# Patient Record
Sex: Female | Born: 1965
Health system: Southern US, Community
[De-identification: ages and names within clinical notes are randomized; demographics above are authoritative.]

## PROBLEM LIST (undated history)

## (undated) DIAGNOSIS — B009 Herpesviral infection, unspecified: Secondary | ICD-10-CM

## (undated) DIAGNOSIS — D649 Anemia, unspecified: Secondary | ICD-10-CM

## (undated) DIAGNOSIS — E7800 Pure hypercholesterolemia, unspecified: Secondary | ICD-10-CM

## (undated) DIAGNOSIS — D219 Benign neoplasm of connective and other soft tissue, unspecified: Secondary | ICD-10-CM

## (undated) DIAGNOSIS — R011 Cardiac murmur, unspecified: Secondary | ICD-10-CM

## (undated) DIAGNOSIS — R7303 Prediabetes: Secondary | ICD-10-CM

## (undated) DIAGNOSIS — I10 Essential (primary) hypertension: Secondary | ICD-10-CM

## (undated) HISTORY — DX: Prediabetes: R73.03

## (undated) HISTORY — DX: Pure hypercholesterolemia, unspecified: E78.00

## (undated) HISTORY — DX: Anemia, unspecified: D64.9

## (undated) HISTORY — DX: Cardiac murmur, unspecified: R01.1

## (undated) HISTORY — PX: BREAST EXCISIONAL BIOPSY: SUR124

## (undated) HISTORY — DX: Essential (primary) hypertension: I10

## (undated) HISTORY — DX: Herpesviral infection, unspecified: B00.9

## (undated) HISTORY — PX: UTERINE FIBROID EMBOLIZATION: SHX825

## (undated) HISTORY — PX: PELVIC LAPAROSCOPY: SHX162

## (undated) HISTORY — PX: HYSTEROPLASTY: SHX988

## (undated) HISTORY — DX: Benign neoplasm of connective and other soft tissue, unspecified: D21.9

---

## 1999-06-19 ENCOUNTER — Other Ambulatory Visit: Admission: RE | Admit: 1999-06-19 | Discharge: 1999-06-19 | Payer: Self-pay | Admitting: Obstetrics and Gynecology

## 2000-06-17 ENCOUNTER — Other Ambulatory Visit: Admission: RE | Admit: 2000-06-17 | Discharge: 2000-06-17 | Payer: Self-pay | Admitting: Internal Medicine

## 2001-06-18 ENCOUNTER — Other Ambulatory Visit: Admission: RE | Admit: 2001-06-18 | Discharge: 2001-06-18 | Payer: Self-pay | Admitting: Gynecology

## 2002-06-22 ENCOUNTER — Other Ambulatory Visit: Admission: RE | Admit: 2002-06-22 | Discharge: 2002-06-22 | Payer: Self-pay | Admitting: Gynecology

## 2003-06-28 ENCOUNTER — Other Ambulatory Visit: Admission: RE | Admit: 2003-06-28 | Discharge: 2003-06-28 | Payer: Self-pay | Admitting: Gynecology

## 2003-07-18 HISTORY — PX: MYOMECTOMY ABDOMINAL APPROACH: SUR870

## 2003-08-13 ENCOUNTER — Inpatient Hospital Stay (HOSPITAL_COMMUNITY): Admission: AD | Admit: 2003-08-13 | Discharge: 2003-08-16 | Payer: Self-pay | Admitting: Gynecology

## 2003-08-13 ENCOUNTER — Encounter (INDEPENDENT_AMBULATORY_CARE_PROVIDER_SITE_OTHER): Payer: Self-pay

## 2003-08-14 ENCOUNTER — Encounter: Payer: Self-pay | Admitting: Gynecology

## 2003-08-16 ENCOUNTER — Encounter: Payer: Self-pay | Admitting: Gynecology

## 2003-10-27 ENCOUNTER — Encounter: Admission: RE | Admit: 2003-10-27 | Discharge: 2003-10-27 | Payer: Self-pay | Admitting: Gynecology

## 2004-06-28 ENCOUNTER — Other Ambulatory Visit: Admission: RE | Admit: 2004-06-28 | Discharge: 2004-06-28 | Payer: Self-pay | Admitting: Gynecology

## 2004-12-17 HISTORY — PX: BREAST BIOPSY: SHX20

## 2005-06-29 ENCOUNTER — Other Ambulatory Visit: Admission: RE | Admit: 2005-06-29 | Discharge: 2005-07-10 | Payer: Self-pay | Admitting: Gynecology

## 2005-12-19 ENCOUNTER — Encounter: Admission: RE | Admit: 2005-12-19 | Discharge: 2005-12-19 | Payer: Self-pay | Admitting: Gynecology

## 2006-01-22 ENCOUNTER — Encounter: Admission: RE | Admit: 2006-01-22 | Discharge: 2006-01-22 | Payer: Self-pay | Admitting: Gynecology

## 2006-05-08 ENCOUNTER — Encounter (INDEPENDENT_AMBULATORY_CARE_PROVIDER_SITE_OTHER): Payer: Self-pay | Admitting: *Deleted

## 2006-05-08 ENCOUNTER — Ambulatory Visit (HOSPITAL_BASED_OUTPATIENT_CLINIC_OR_DEPARTMENT_OTHER): Admission: RE | Admit: 2006-05-08 | Discharge: 2006-05-08 | Payer: Self-pay | Admitting: *Deleted

## 2006-05-08 ENCOUNTER — Encounter: Admission: RE | Admit: 2006-05-08 | Discharge: 2006-05-08 | Payer: Self-pay | Admitting: *Deleted

## 2006-07-12 ENCOUNTER — Other Ambulatory Visit: Admission: RE | Admit: 2006-07-12 | Discharge: 2006-07-12 | Payer: Self-pay | Admitting: Gynecology

## 2007-07-23 ENCOUNTER — Other Ambulatory Visit: Admission: RE | Admit: 2007-07-23 | Discharge: 2007-07-23 | Payer: Self-pay | Admitting: Gynecology

## 2007-09-22 ENCOUNTER — Encounter: Admission: RE | Admit: 2007-09-22 | Discharge: 2007-09-22 | Payer: Self-pay | Admitting: Gynecology

## 2008-07-26 ENCOUNTER — Other Ambulatory Visit: Admission: RE | Admit: 2008-07-26 | Discharge: 2008-07-26 | Payer: Self-pay | Admitting: Gynecology

## 2008-08-13 ENCOUNTER — Other Ambulatory Visit: Admission: RE | Admit: 2008-08-13 | Discharge: 2008-08-13 | Payer: Self-pay | Admitting: Gynecology

## 2008-10-26 ENCOUNTER — Encounter: Admission: RE | Admit: 2008-10-26 | Discharge: 2008-10-26 | Payer: Self-pay | Admitting: Gynecology

## 2009-08-04 ENCOUNTER — Other Ambulatory Visit: Admission: RE | Admit: 2009-08-04 | Discharge: 2009-08-04 | Payer: Self-pay | Admitting: Gynecology

## 2009-08-04 ENCOUNTER — Ambulatory Visit: Payer: Self-pay | Admitting: Women's Health

## 2009-08-04 ENCOUNTER — Encounter: Payer: Self-pay | Admitting: Women's Health

## 2009-10-27 ENCOUNTER — Encounter: Admission: RE | Admit: 2009-10-27 | Discharge: 2009-10-27 | Payer: Self-pay | Admitting: Gynecology

## 2009-11-17 ENCOUNTER — Encounter: Admission: RE | Admit: 2009-11-17 | Discharge: 2009-11-17 | Payer: Self-pay | Admitting: Gynecology

## 2010-08-11 ENCOUNTER — Other Ambulatory Visit: Admission: RE | Admit: 2010-08-11 | Discharge: 2010-08-11 | Payer: Self-pay | Admitting: Gynecology

## 2010-08-11 ENCOUNTER — Ambulatory Visit: Payer: Self-pay | Admitting: Women's Health

## 2010-11-21 ENCOUNTER — Encounter: Admission: RE | Admit: 2010-11-21 | Discharge: 2010-11-21 | Payer: Self-pay | Admitting: Gynecology

## 2010-12-29 ENCOUNTER — Encounter
Admission: RE | Admit: 2010-12-29 | Discharge: 2010-12-29 | Payer: Self-pay | Source: Home / Self Care | Attending: Gynecology | Admitting: Gynecology

## 2011-01-07 ENCOUNTER — Encounter: Payer: Self-pay | Admitting: Gynecology

## 2011-04-09 ENCOUNTER — Ambulatory Visit (INDEPENDENT_AMBULATORY_CARE_PROVIDER_SITE_OTHER): Payer: Federal, State, Local not specified - PPO | Admitting: Women's Health

## 2011-04-09 DIAGNOSIS — N898 Other specified noninflammatory disorders of vagina: Secondary | ICD-10-CM

## 2011-04-09 DIAGNOSIS — Z113 Encounter for screening for infections with a predominantly sexual mode of transmission: Secondary | ICD-10-CM

## 2011-04-09 DIAGNOSIS — B373 Candidiasis of vulva and vagina: Secondary | ICD-10-CM

## 2011-05-04 NOTE — Discharge Summary (Signed)
   NAME:  Christina Sweeney, Christina Sweeney                        ACCOUNT NO.:  [id number]   MEDICAL RECORD NO.:  [id number]                   PATIENT TYPE:  INP   LOCATION:  9116                                 FACILITY:  [facility]   PHYSICIAN:  Juan H. Lily Peer, M.D.             DATE OF BIRTH:  [birth year]   DATE OF ADMISSION:  08/13/2003  DATE OF DISCHARGE:  08/16/2003                                 DISCHARGE SUMMARY   DISCHARGE DIAGNOSES:  1. Symptomatic leiomyomata uterus with anemia.  2. Menorrhagia.   PROCEDURE:  Myomectomy.   HISTORY OF PRESENT ILLNESS:  This 45 year old gravida 0 since 2000, has been  suffering with menorrhagia and history of a leiomyomata uterus. She has had  several serial ultrasounds, her last 1 in 2001 and an ultrasound in 2004  demonstrating a uterus measuring approximately 14 week size with multiple  intramural myomas, subserosal, and fibroids also. Right and left ovary  appear normal. Her CBC demonstrated a hemoglobin of 11.9. She had decided  because of the menorrhagia, long term anemia, and desiring a pregnancy, to  have these fibroids removed.   HOSPITAL COURSE:  The patient is admitted on August 13, 2003 for a  myomectomy, which was performed by Dr. Lily Peer under general anesthesia.  She had multiple myomas, approximately 25, that were removed.  Postoperatively, she remained afebrile. No difficulties voiding. She did  have shortness of breath and she did have a chest x-ray. The first one  showing low lung volumes and mild bilateral subsegmental atelectasis. She  had a repeat one done on August 16, 2003, which showed no acute disease. The  patient also was feeling much better with the second one. She was discharged  in satisfactory condition on the third postoperative day.   LABORATORY DATA:  Postoperative laboratory white count was 11.2, hemoglobin  10.1, hematocrit 29. Platelets of 2,000.   DISPOSITION:  The patient was discharged to home.   FOLLOW UP:   Instructed to followup in 2 weeks and as needed.   DISCHARGE MEDICATIONS:  1. Prescription for Tylox was given.  2. Continue multivitamins and iron daily.   SPECIAL INSTRUCTIONS:  Staples were removed. Steri-strips applied.  Instructions reviewed.     Susa Loffler, P.A.                    Juan H. Lily Peer, M.D.    TSG/MEDQ  D:  08/30/2003  T:  08/30/2003  Job:  [id number]

## 2011-05-04 NOTE — Op Note (Signed)
NAME:  Christina Sweeney, Christina Sweeney                        ACCOUNT NO.:  [id number]   MEDICAL RECORD NO.:  [id number]                   PATIENT TYPE:  INP   LOCATION:  9327                                 [facility]ACILITY:  [facility]   PHYSICIAN:  Juan H. Lily Peer, M.D.             DATE OF BIRTH:  1966/08/22   DATE OF PROCEDURE:  08/13/2003  DATE OF DISCHARGE:                                 OPERATIVE REPORT   SURGEON:  Juan H. Lily Peer, M.D.   FIRST ASSISTANT:  Daniel L. Eda Paschal, M.D.   INDICATIONS FOR PROCEDURE:  45 year old female, gravida 0, para 0, with long-  standing  history of symptomatic leiomyomatous uteri resulting in  menorrhagia, dysmenorrhea, and anemia.   PREOPERATIVE DIAGNOSIS:  Symptomatic leiomyomatous uteri.   POSTOPERATIVE DIAGNOSIS:  Symptomatic leiomyomatous uteri.   ANESTHESIA:  General endotracheal anesthesia.   PROCEDURE PERFORMED:  1. Chromopertubation.  2. Abdominal myomectomy.   FINDINGS:  1. Multiple subserosal and intramural leiomyomas.  2. Normal fallopian tubes bilaterally.  3. Free flowing dye via chromopertubation for the left fallopian tube.  4. Proximal tubal obstruction secondary to fibroids in the utero-tubal     junction, no spillage from the right fallopian tube.   DESCRIPTION OF PROCEDURE:  After the patient was adequately counseled, she  was taken to the operating room where she underwent a successful general  endotracheal anesthesia.  The abdomen, vagina, and perineum were prepped and  draped in the usual sterile fashion.  A speculum was inserted into the  vaginal vault and a single tooth tenaculum was placed on the anterior  cervical lip.  A pediatric Foley catheter was inserted into the intrauterine  cavity and inflated in an effort to do a chromopertubation interoperatively.  The single tooth tenaculum and the speculum were removed.  Drapes were then  placed after the Foley catheter was placed into the bladder and a  Pfannenstiel skin incision  was made 2 cm above the symphysis pubis.  The  incision was carried down through the skin, subcutaneous tissue, down to the  rectus fascia where a midline nick was made.  The fascia was incised in a  transverse fashion and the peritoneal cavity was entered.  Due to the  multiple leiomyomas that were present, the uterus was able to be  exteriorized and the base of the subserosal leiomyomas were infiltrated with  Pitressin, 20 units in 20 mL for 1 to 1 dilution, and were excised with the  fine tip Bovie.  A fundal vertical incision was made on top of the uterus  whereby the majority of the fibroids were located and were able to be  enucleated.  Total number of fibroids removed were 23.  The intrauterine  cavity was entered, some of these fibroids were impinging into the  endometrial cavity.  The endometrium was closed with a running stitch of 3-0  Vicryl suture and the uterine muscle was reapproximated with a running  stitch of 0 Vicryl suture and the serosa was reapproximated with a running  stitch of 3-0 Vicryl suture.  The two large anterior fibroids that were  enucleated, the serosa was reapproximated with a running stitch of 3-0  Vicryl suture.  Pre and post myomectomy procedures were obtained.  Of note,  before the myomectomy, the chromopertubation was accomplished whereby free  flowing dye was noted from the left fallopian tube but the right did not  spill as a result of a large leiomyoma that was compressing right at the  uterotubal junction.  The uterus, tubes, and ovaries were placed back into  the uterine cavity.  The tubes and ovaries, otherwise, looked normal.  The  pelvic cavity was copiously irrigated with normal saline solution.  Sponge  count and needle counts were correct.  The visceroperitoneum was not  reapproximated but the rectus fascia was closed with a running stitch of 0  Vicryl suture.  The subcutaneous bleeders were Bovie cauterized and the skin  was reapproximated  with 3-0 Vicryl suture via a Keith needle.  The incision  had Steri-Strips applied.  0.25% Marcaine for a total of 10 mL was  infiltrated into the skin for postoperative analgesia and a pressure  dressing was placed.  The patient was extubated and transferred to the  recovery room with stable vital signs.  The intrauterine catheter was  removed, the bladder catheter was left in place, total blood loss from the  procedure was 100 mL.  Urine output 1500 mL.  IV fluids consisted of 1500 mL  of lactated ringers.  She did receive 2 grams Cefotan prophylactically  before her surgery.  She had PSA stockings for DVT prophylaxis.                                               Juan H. Lily Peer, M.D.    JHF/MEDQ  D:  08/13/2003  T:  08/13/2003  Job:  [id number]

## 2011-05-04 NOTE — Op Note (Signed)
Christina Sweeney, Christina Sweeney              ACCOUNT NO.:  [id number]   MEDICAL RECORD NO.:  [id number]          PATIENT TYPE:  AMB   LOCATION:  DSC                          FACILITY:  [facility]   PHYSICIAN:  Alfonse Ras, MD   DATE OF BIRTH:  [birth year]   DATE OF PROCEDURE:  05/08/2006  DATE OF DISCHARGE:                                 OPERATIVE REPORT   PREOPERATIVE DIAGNOSIS:  Abnormal right mammogram.   POSTOPERATIVE DIAGNOSIS:  Abnormal right mammogram.   PROCEDURE:  Needle localization right breast biopsy.   SURGEON:  Alfonse Ras, M.D.   ANESTHESIA:  General.   DESCRIPTION OF PROCEDURE:  The patient was taken to the operating room and  placed in the supine position.  After adequate anesthesia was induced using  laryngeal mask, the right breast was prepped and draped in the usual sterile  fashion.  Using a transverse incision over the lateral aspect of the right  breast extending from the areola out laterally, I dissected down onto the  wire excising all tissue around the reinforced portion of the wire down to  the pectoralis fascia.  All this tissue was removed en block.  The wire did  slip during excision.  It was reinserted along the same tract.  Specimen  mammography verified the presence of the calcifications and adequate  hemostasis was ensured and the skin was closed with subcuticular 4-0  Monocryl.  Steri-Strips, sterile dressing was applied.  The patient  tolerated the procedure well and went to PACU in good condition.      Alfonse Ras, MD  Electronically Signed     KRE/MEDQ  D:  05/08/2006  T:  05/08/2006  Job:  [id number]

## 2011-05-04 NOTE — H&P (Signed)
NAME:  Christina Sweeney, Christina Sweeney                        ACCOUNT NO.:  [id number]   MEDICAL RECORD NO.:  [id number]                   PATIENT TYPE:  INP   LOCATION:  9399                                 [facility]ACILITY[facility]   PHYSICIAN:  Juan H. Lily Peer, M.D.             DATE OF BIRTH:  [birth year]   DATE OF ADMISSION:  08/13/2003  DATE OF DISCHARGE:                                HISTORY & PHYSICAL   CHIEF COMPLAINT:  Symptomatic leiomyomatous uteri and anemia.   HISTORY OF PRESENT ILLNESS:  The patient is a 45 year old, G0 who since the  year 2000, has been suffering from menorrhagia and has a history of  leiomyomatous uteri and had been followed with serial ultrasounds.  Her last  one was in 2001, and now the ultrasound done on June 28, 2003, demonstrating  a uterus measuring approximately 14-week size with measurements of 14.1 x  11.2 x 9.4 cm with multiple intramural myomas, subserosal averaging between  4-6 cm in size.  The right ovary and left ovary appeared to be normal as  well.  The cul-de-sac was negative.  She has a Pap smear done at that time  as well which reported to be normal.  Her CBC demonstrated a hemoglobin of  11.9 and platelet count of 251,000.  She had decided because of her  menorrhagia and long-term anemia, for which she had been on iron  supplementation for several years of one tablet daily and the pressure of  these fibroids on her bladder causing her urinary frequency and bloating,  that she would proceed with myomectomy since she is contemplating on getting  pregnant in the near future.   ALLERGIES:  No known drug allergies.   PAST MEDICAL HISTORY:  1. Herpes for which she has taken Valtrex in the past.  2. Borderline hypertension on no medications.   PAST SURGICAL HISTORY:  No prior operations reported.   MEDICATIONS:  1. Multivitamin.  2. Calcium supplementation.   FAMILY HISTORY:  Mother with history of hypertension.   PHYSICAL EXAMINATION:  VITAL SIGNS:   Blood pressure 130/92, weight 158  pounds, height 5 feet 2.5 inches tall.  HEENT:  Unremarkable.  NECK:  Supple.  Trachea midline.  No carotid bruits.  No thyromegaly.  LUNGS:  Clear to auscultation without any rhonchi or wheezes.  HEART:  Regular rate and rhythm with no murmurs, rubs or gallops.  BREASTS:  Not done.  ABDOMEN:  Soft, nontender.  PELVIC:  A 14-week size, irregular shaped uterus.  Adnexa difficult to  palpate.  RECTAL:  Not done.   ASSESSMENT:  This is a 45 year old, G0 with symptomatic leiomyomatous uteri  and iron deficiency anemia scheduled to undergo abdominal myomectomy on  August 27.  When the patient was seen in the office for preoperative  consultation on August 23, we discussed the possibility of putting her on  Lupron for three months prior to bringing her iron level  up to a higher  range of what it currently is and then be able to donate one to two units of  autologous blood.  She has stated that she wanted to proceed with the  surgery now.  She is fully aware of the potential risk for hemorrhage and  the need for a blood transfusion with its potential risk of anaphylactic  reaction, hepatitis and acquired immunodeficiency syndrome, the risk of  internal trauma to organs and also the risk of infection, although she will  receive prophylaxis antibiotic and also the risk of deep venous thrombosis  and pulmonary embolism.  Also, as a last saving method in the event that  hemorrhage would not be able to be contained surgically that she could  potentially end up losing her reproductive organs all together and would not  be able to have any children.  All these issues were discussed with the  patient and she would like to proceed with the surgery.  All questions were  answered and will follow accordingly.   PLAN:  The patient is scheduled to undergo abdominal myomectomy on August  27, at 7:30 a.m. at [facility].                                                [place] H. Lily Peer, M.D.    JHF/MEDQ  D:  08/13/2003  T:  08/13/2003  Job:  [id number]

## 2011-05-31 ENCOUNTER — Other Ambulatory Visit: Payer: Self-pay | Admitting: Gynecology

## 2011-05-31 DIAGNOSIS — N6310 Unspecified lump in the right breast, unspecified quadrant: Secondary | ICD-10-CM

## 2011-07-20 ENCOUNTER — Ambulatory Visit
Admission: RE | Admit: 2011-07-20 | Discharge: 2011-07-20 | Disposition: A | Discharge status: Home / Self Care | Payer: Federal, State, Local not specified - PPO | Source: Ambulatory Visit | Attending: Gynecology | Admitting: Gynecology

## 2011-07-20 DIAGNOSIS — N6310 Unspecified lump in the right breast, unspecified quadrant: Secondary | ICD-10-CM

## 2011-08-06 ENCOUNTER — Encounter: Payer: Self-pay | Admitting: *Deleted

## 2011-08-06 DIAGNOSIS — B009 Herpesviral infection, unspecified: Secondary | ICD-10-CM | POA: Insufficient documentation

## 2011-08-13 ENCOUNTER — Encounter: Payer: Self-pay | Admitting: Women's Health

## 2011-08-13 ENCOUNTER — Ambulatory Visit (INDEPENDENT_AMBULATORY_CARE_PROVIDER_SITE_OTHER): Payer: Federal, State, Local not specified - PPO | Admitting: Women's Health

## 2011-08-13 ENCOUNTER — Other Ambulatory Visit (HOSPITAL_COMMUNITY)
Admission: RE | Admit: 2011-08-13 | Discharge: 2011-08-13 | Disposition: A | Discharge status: Home / Self Care | Payer: Federal, State, Local not specified - PPO | Source: Ambulatory Visit | Attending: Women's Health | Admitting: Women's Health

## 2011-08-13 VITALS — BP 120/70 | Ht 63.5 in | Wt 155.0 lb

## 2011-08-13 DIAGNOSIS — Z01419 Encounter for gynecological examination (general) (routine) without abnormal findings: Secondary | ICD-10-CM | POA: Insufficient documentation

## 2011-08-13 DIAGNOSIS — Z113 Encounter for screening for infections with a predominantly sexual mode of transmission: Secondary | ICD-10-CM

## 2011-08-13 DIAGNOSIS — D259 Leiomyoma of uterus, unspecified: Secondary | ICD-10-CM

## 2011-08-13 DIAGNOSIS — R011 Cardiac murmur, unspecified: Secondary | ICD-10-CM | POA: Insufficient documentation

## 2011-08-13 DIAGNOSIS — I10 Essential (primary) hypertension: Secondary | ICD-10-CM | POA: Insufficient documentation

## 2011-08-13 DIAGNOSIS — D219 Benign neoplasm of connective and other soft tissue, unspecified: Secondary | ICD-10-CM | POA: Insufficient documentation

## 2011-08-13 LAB — HEPATITIS B SURFACE ANTIGEN: Hepatitis B Surface Ag: NEGATIVE

## 2011-08-13 LAB — RPR

## 2011-08-13 LAB — HIV ANTIBODY (ROUTINE TESTING W REFLEX): HIV: NONREACTIVE

## 2011-08-13 LAB — HEPATITIS C ANTIBODY: HCV Ab: NEGATIVE

## 2011-08-13 NOTE — Progress Notes (Signed)
Christina Sweeney 1966-01-01 [id number]    History:    The patient presents for annual exam.  Works as a Armed forces operational officer.  Past medical history, past surgical history, family history and social history were all reviewed and documented in the EPIC chart.   ROS:  A  ROS was performed and pertinent positives and negatives are included in the history.  Exam:  Filed Vitals:   08/13/11 0802  BP: 120/70    General appearance:  Normal Head/Neck:  Normal, without cervical or supraclavicular adenopathy. Thyroid:  Symmetrical, normal in size, without palpable masses or nodularity. Respiratory  Effort:  Normal  Auscultation:  Clear without wheezing or rhonchi Cardiovascular  Auscultation:  Regular rate, without rubs, murmurs or gallops  Edema/varicosities:  Not grossly evident Abdominal  Soft,nontender, without masses, guarding or rebound.  Liver/spleen:  No organomegaly noted  Hernia:  None appreciated  Skin  Inspection:  Grossly normal  Palpation:  Grossly normal Neurologic/psychiatric  Orientation:  Normal with appropriate conversation.  Mood/affect:  Normal  Genitourinary    Breasts: Examined lying and sitting.     Right: Without masses, retractions, discharge or axillary adenopathy. Breast bx outer aspect 06  Neg     Left: Without masses, retractions, discharge or axillary adenopathy.   Inguinal/mons:  Normal without inguinal adenopathy  External genitalia:  Normal  BUS/Urethra/Skene's glands:  Normal  Bladder:  Normal  Vagina:  Normal  Cervix:  Normal  Uterus:  retroverted, enlarged 16 wk size,fibroids, mobile  Adnexa/parametria:     Rt: Without masses or tenderness.   Lt: Without masses or tenderness.  Anus and perineum: Normal  Digital rectal exam: Normal sphincter tone without palpated masses or tenderness  Assessment/Plan:  46 y.o. SBF G0 for annual exam. Monthly 7 day cycle, several days are heavy , using super plus tampons and pads, and changing every 2 hours.  History of a fibroid uterus, does feel enlarged, will get an ultrasound to evaluate. She has a history of a myomectomy with good relief  of menorragia.. reviewed hysterectomy, significant history mother died of ovarian cancer and did review doing hysterectomy with oophorectomy also. [employer]CO[employer] pamphlet on hysterectomy was given and reviewed.  SBEs, had mammogram in January with [place] and FU in August showjng stability. History of benign bx, report any changes with SBEs.  Continue exercise, she does have a healthy exercise routine, and has lost 10 pounds. Continue with MVI and iron supplement. She had normal labs at her primary care were her hypertension is managed. Pap, GC Chlamydia, HIV hepatitis B,C and RPR.   Harrington Challenger [facility], 8:47 AM 08/13/2011

## 2011-08-15 ENCOUNTER — Encounter: Payer: Self-pay | Admitting: Women's Health

## 2011-08-15 ENCOUNTER — Other Ambulatory Visit: Payer: Federal, State, Local not specified - PPO

## 2011-08-15 ENCOUNTER — Ambulatory Visit (INDEPENDENT_AMBULATORY_CARE_PROVIDER_SITE_OTHER): Payer: Federal, State, Local not specified - PPO | Admitting: Women's Health

## 2011-08-15 DIAGNOSIS — N852 Hypertrophy of uterus: Secondary | ICD-10-CM

## 2011-08-15 DIAGNOSIS — N8300 Follicular cyst of ovary, unspecified side: Secondary | ICD-10-CM

## 2011-08-15 DIAGNOSIS — N920 Excessive and frequent menstruation with regular cycle: Secondary | ICD-10-CM

## 2011-08-15 DIAGNOSIS — B009 Herpesviral infection, unspecified: Secondary | ICD-10-CM

## 2011-08-15 DIAGNOSIS — D259 Leiomyoma of uterus, unspecified: Secondary | ICD-10-CM

## 2011-08-15 DIAGNOSIS — D251 Intramural leiomyoma of uterus: Secondary | ICD-10-CM

## 2011-08-15 DIAGNOSIS — D219 Benign neoplasm of connective and other soft tissue, unspecified: Secondary | ICD-10-CM

## 2011-08-15 NOTE — Progress Notes (Signed)
  Was noted to have an enlarged uterus at her annual exam, history of fibroids,  7 day heavy with clots monthly cycles.  Ultrasound shows multiple fibroids that have enlarged from one year ago. Fibroids are clear 34 x 31 mm 33 x 30 mm 40 x 46 mm 44 x 31 mm 76 x 87 x 38 x 38 mm the endometrial cavity is displaced 5 fibroids. Ovaries are normal.  Options were reviewed. She does have a history of hypertension and did review birth control pills are not the best option due to risks of clots and strokes. Ablation would be difficult do to the subserous fibroids. Reviewed hysterectomy, reviewed Lupron prior to the surgery. She will think about what she would like to do and return to the office to review with Dr. Lily Peer.

## 2011-09-04 ENCOUNTER — Other Ambulatory Visit: Payer: Self-pay | Admitting: Women's Health

## 2011-09-04 NOTE — Telephone Encounter (Signed)
NANCY I DO NOT SEE RX IN EMR RECENT AEX OR IN HER PAPER CHART. OK TO REFILL?

## 2011-09-05 NOTE — Telephone Encounter (Signed)
[name], please call in Valtrex 500 by mouth twice a day 3-5 days when necessary #30 with 12 refills .

## 2011-09-05 NOTE — Telephone Encounter (Signed)
PER JOHN WITH [facility] HE ALREADY RECEIVED RX NANCY SENT THRU [facility].

## 2011-11-14 ENCOUNTER — Other Ambulatory Visit: Payer: Self-pay | Admitting: Gynecology

## 2011-11-14 DIAGNOSIS — Z1231 Encounter for screening mammogram for malignant neoplasm of breast: Secondary | ICD-10-CM

## 2011-11-26 ENCOUNTER — Ambulatory Visit
Admission: RE | Admit: 2011-11-26 | Discharge: 2011-11-26 | Disposition: A | Discharge status: Home / Self Care | Payer: Federal, State, Local not specified - PPO | Source: Ambulatory Visit | Attending: Gynecology | Admitting: Gynecology

## 2011-11-26 DIAGNOSIS — Z1231 Encounter for screening mammogram for malignant neoplasm of breast: Secondary | ICD-10-CM

## 2012-01-24 ENCOUNTER — Encounter: Payer: Self-pay | Admitting: *Deleted

## 2012-01-24 NOTE — Progress Notes (Signed)
Patient ID: Christina Sweeney, female   DOB: Nov 08, 1966, 46 y.o.   MRN: [id number] Pt called wanting blood type, pt informed O positive

## 2012-04-16 HISTORY — PX: HYSTEROSCOPY: SHX211

## 2012-08-15 ENCOUNTER — Encounter: Payer: Self-pay | Admitting: Women's Health

## 2012-08-15 ENCOUNTER — Other Ambulatory Visit (HOSPITAL_COMMUNITY)
Admission: RE | Admit: 2012-08-15 | Discharge: 2012-08-15 | Disposition: A | Discharge status: Home / Self Care | Payer: Federal, State, Local not specified - PPO | Source: Ambulatory Visit | Attending: Women's Health | Admitting: Women's Health

## 2012-08-15 ENCOUNTER — Ambulatory Visit (INDEPENDENT_AMBULATORY_CARE_PROVIDER_SITE_OTHER): Payer: Federal, State, Local not specified - PPO | Admitting: Women's Health

## 2012-08-15 VITALS — BP 118/66 | Ht 63.0 in | Wt 160.0 lb

## 2012-08-15 DIAGNOSIS — E079 Disorder of thyroid, unspecified: Secondary | ICD-10-CM

## 2012-08-15 DIAGNOSIS — Z1151 Encounter for screening for human papillomavirus (HPV): Secondary | ICD-10-CM | POA: Insufficient documentation

## 2012-08-15 DIAGNOSIS — D259 Leiomyoma of uterus, unspecified: Secondary | ICD-10-CM

## 2012-08-15 DIAGNOSIS — Z01419 Encounter for gynecological examination (general) (routine) without abnormal findings: Secondary | ICD-10-CM

## 2012-08-15 DIAGNOSIS — D219 Benign neoplasm of connective and other soft tissue, unspecified: Secondary | ICD-10-CM

## 2012-08-15 DIAGNOSIS — B009 Herpesviral infection, unspecified: Secondary | ICD-10-CM

## 2012-08-15 LAB — CBC WITH DIFFERENTIAL/PLATELET
Basophils Absolute: 0 10*3/uL (ref 0.0–0.1)
Basophils Relative: 1 % (ref 0–1)
Eosinophils Absolute: 0.1 10*3/uL (ref 0.0–0.7)
Eosinophils Relative: 2 % (ref 0–5)
MCH: 30.3 pg (ref 26.0–34.0)
MCV: 89.1 fL (ref 78.0–100.0)
Platelets: 247 10*3/uL (ref 150–400)
RDW: 13.3 % (ref 11.5–15.5)
WBC: 4.2 10*3/uL (ref 4.0–10.5)

## 2012-08-15 MED ORDER — VALACYCLOVIR HCL 500 MG PO TABS: 500.0000 mg | ORAL_TABLET | Freq: Two times a day (BID) | ORAL | Status: DC

## 2012-08-15 NOTE — Progress Notes (Signed)
IZOLA TEAGUE Aug 17, 1966 [id number]    History:    The patient presents for annual exam.  Having regular monthly 3-4 day cycle with no bleeding in between. History of menorrhagia, uterine embolization at [facility] May 2013, developed an infection and had to have a second surgery to remove fibroids. Myomectomy 2004. States is doing well now. History of normal Paps and mammograms. History of a benign breast biopsy in 06. History of HSV with rare outbreaks. Hypertensive on Norvasc per primary care, normal lipid panel.   Past medical history, past surgical history, family history and social history were all reviewed and documented in the EPIC chart.  Armed forces operational officer.  ROS:  A  ROS was performed and pertinent positives and negatives are included in the history.  Exam:  Filed Vitals:   08/15/12 0918  BP: 118/66    General appearance:  Normal Head/Neck:  Normal, without cervical or supraclavicular adenopathy. Thyroid:  Symmetrical, normal in size, without palpable masses or nodularity. Respiratory  Effort:  Normal  Auscultation:  Clear without wheezing or rhonchi Cardiovascular  Auscultation:  Regular rate, without rubs, murmurs or gallops  Edema/varicosities:  Not grossly evident Abdominal  Soft,nontender, without masses, guarding or rebound.  Liver/spleen:  No organomegaly noted  Hernia:  None appreciated  Skin  Inspection:  Grossly normal  Palpation:  Grossly normal Neurologic/psychiatric  Orientation:  Normal with appropriate conversation.  Mood/affect:  Normal  Genitourinary    Breasts: Examined lying and sitting.     Right: Without masses, retractions, discharge or axillary adenopathy.     Left: Without masses, retractions, discharge or axillary adenopathy.   Inguinal/mons:  Normal without inguinal adenopathy  External genitalia:  Normal  BUS/Urethra/Skene's glands:  Normal  Bladder:  Normal  Vagina:  Normal  Cervix:  Normal  Uterus: 8 weeks' size, shape and contour.   Midline and mobile  Adnexa/parametria:     Rt: Without masses or tenderness.   Lt: Without masses or tenderness.  Anus and perineum: Normal  Digital rectal exam: Normal sphincter tone without palpated masses or tenderness  Assessment/Plan:  46 y.o. SBF G0 for annual exam with no complaints other than difficulty with losing weight with healthy diet and exercise.  HSV-rare outbreaks  Fibroids/myomectomy-uterine embolization 2013 Hypertension stable on meds per primary care  Plan: SBE's, continue annual mammogram, calcium rich diet, MVI daily encouraged. Continue healthy exercise and diet. CBC, TSH, UA and Pap. Reviewed new screening guidelines, preferred Pap. Valtrex 500 by mouth twice a day when necessary for 3-5 days, prescription proper use reviewed.  Harrington Challenger [facility], 10:09 AM 08/15/2012

## 2012-08-15 NOTE — Patient Instructions (Signed)

## 2012-08-15 NOTE — Addendum Note (Signed)
Addended by: Dayna Barker on: 08/15/2012 10:25 AM   Modules accepted: Orders

## 2012-08-16 LAB — URINALYSIS W MICROSCOPIC + REFLEX CULTURE
Casts: NONE SEEN
Glucose, UA: NEGATIVE mg/dL
Hgb urine dipstick: NEGATIVE
Leukocytes, UA: NEGATIVE
Squamous Epithelial / LPF: NONE SEEN
pH: 6.5 (ref 5.0–8.0)

## 2012-08-29 ENCOUNTER — Other Ambulatory Visit: Payer: Self-pay | Admitting: Women's Health

## 2012-11-25 ENCOUNTER — Other Ambulatory Visit: Payer: Self-pay | Admitting: Gynecology

## 2012-11-25 DIAGNOSIS — Z1231 Encounter for screening mammogram for malignant neoplasm of breast: Secondary | ICD-10-CM

## 2012-12-01 ENCOUNTER — Ambulatory Visit
Admission: RE | Admit: 2012-12-01 | Discharge: 2012-12-01 | Disposition: A | Discharge status: Home / Self Care | Payer: Federal, State, Local not specified - PPO | Source: Ambulatory Visit | Attending: Gynecology | Admitting: Gynecology

## 2012-12-01 DIAGNOSIS — Z1231 Encounter for screening mammogram for malignant neoplasm of breast: Secondary | ICD-10-CM

## 2013-08-21 ENCOUNTER — Ambulatory Visit (INDEPENDENT_AMBULATORY_CARE_PROVIDER_SITE_OTHER): Payer: Federal, State, Local not specified - PPO | Admitting: Women's Health

## 2013-08-21 ENCOUNTER — Encounter: Payer: Self-pay | Admitting: Women's Health

## 2013-08-21 ENCOUNTER — Other Ambulatory Visit (HOSPITAL_COMMUNITY)
Admission: RE | Admit: 2013-08-21 | Discharge: 2013-08-21 | Disposition: A | Discharge status: Home / Self Care | Payer: Federal, State, Local not specified - PPO | Source: Ambulatory Visit | Attending: Gynecology | Admitting: Gynecology

## 2013-08-21 VITALS — BP 130/84 | Ht 63.5 in | Wt 163.2 lb

## 2013-08-21 DIAGNOSIS — Z01419 Encounter for gynecological examination (general) (routine) without abnormal findings: Secondary | ICD-10-CM | POA: Insufficient documentation

## 2013-08-21 DIAGNOSIS — Z113 Encounter for screening for infections with a predominantly sexual mode of transmission: Secondary | ICD-10-CM

## 2013-08-21 DIAGNOSIS — D259 Leiomyoma of uterus, unspecified: Secondary | ICD-10-CM

## 2013-08-21 DIAGNOSIS — B009 Herpesviral infection, unspecified: Secondary | ICD-10-CM

## 2013-08-21 DIAGNOSIS — D219 Benign neoplasm of connective and other soft tissue, unspecified: Secondary | ICD-10-CM

## 2013-08-21 MED ORDER — VALACYCLOVIR HCL 500 MG PO TABS: 500.0000 mg | ORAL_TABLET | Freq: Two times a day (BID) | ORAL | Status: DC

## 2013-08-21 NOTE — Progress Notes (Signed)
Christina Sweeney February 17, 1966 [id number]    History:    The patient presents for annual exam.  Light monthly cycles/condoms. Myomectomy 2004, fibroid embolization at [facility] 04/2012. Hypertension primary care manages. Normal Pap history. Normal mammograms, benign breast biopsy 2006. Requests STD screening. Mother died of ovarian cancer age 48. HSV rare outbreaks.   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 Has a Heaney Brother do to a high fever as an infant he lives in a group home.   ROS:  A  ROS was performed and pertinent positives and negatives are included in the history.  Exam:  Filed Vitals:   08/21/13 0906  BP: 130/84    General appearance:  Normal Head/Neck:  Normal, without cervical or supraclavicular adenopathy. Thyroid:  Symmetrical, normal in size, without palpable masses or nodularity. Respiratory  Effort:  Normal  Auscultation:  Clear without wheezing or rhonchi Cardiovascular  Auscultation:  Regular rate, without rubs, murmurs or gallops  Edema/varicosities:  Not grossly evident Abdominal  Soft,nontender, without masses, guarding or rebound.  Liver/spleen:  No organomegaly noted  Hernia:  None appreciated  Skin  Inspection:  Grossly normal  Palpation:  Grossly normal Neurologic/psychiatric  Orientation:  Normal with appropriate conversation.  Mood/affect:  Normal  Genitourinary    Breasts: Examined lying and sitting.     Right: Without masses, retractions, discharge or axillary adenopathy.     Left: Without masses, retractions, discharge or axillary adenopathy.   Inguinal/mons:  Normal without inguinal adenopathy  External genitalia:  Normal  BUS/Urethra/Skene's glands:  Normal  Bladder:  Normal  Vagina:  Normal  Cervix:  Normal  Uterus:  12 week size, shape and contour.  Midline and mobile  Adnexa/parametria:      Rt: Without masses or tenderness.   Lt: Without masses or tenderness.  Anus and perineum: Normal  Digital rectal exam: Normal sphincter tone without palpated masses or tenderness  Assessment/Plan:  47 y.o. SBF G0 for annual exam.   Fibroid uterus HSV rare outbreaks Hypertension primary care manages  Plan: SBE's, continue annual mammogram, 3-D tomography reviewed and recommended, dense breasts. Continue healthy lifestyle of exercise and diet, vitamin D 2000 daily encouraged. Pap, Pap normal 2013, preferred having done, GC/Chlamydia, HIV, hep B, C., RPR. Valtrex 500 twice daily for 3-5 days as needed prescription, proper use given and reviewed. Ultrasound will schedule.   Christina Sweeney, 9:43 AM 08/21/2013

## 2013-08-21 NOTE — Addendum Note (Signed)
Addended by: Bertram Savin A on: 08/21/2013 10:01 AM   Modules accepted: Orders

## 2013-08-21 NOTE — Patient Instructions (Addendum)

## 2013-08-22 LAB — HEPATITIS C ANTIBODY: HCV Ab: NEGATIVE

## 2013-08-22 LAB — HIV ANTIBODY (ROUTINE TESTING W REFLEX): HIV: NONREACTIVE

## 2013-09-04 ENCOUNTER — Ambulatory Visit (INDEPENDENT_AMBULATORY_CARE_PROVIDER_SITE_OTHER): Payer: Federal, State, Local not specified - PPO

## 2013-09-04 ENCOUNTER — Encounter: Payer: Self-pay | Admitting: Women's Health

## 2013-09-04 ENCOUNTER — Ambulatory Visit (INDEPENDENT_AMBULATORY_CARE_PROVIDER_SITE_OTHER): Payer: Federal, State, Local not specified - PPO | Admitting: Women's Health

## 2013-09-04 DIAGNOSIS — D259 Leiomyoma of uterus, unspecified: Secondary | ICD-10-CM

## 2013-09-04 DIAGNOSIS — D219 Benign neoplasm of connective and other soft tissue, unspecified: Secondary | ICD-10-CM

## 2013-09-04 DIAGNOSIS — N852 Hypertrophy of uterus: Secondary | ICD-10-CM

## 2013-09-04 DIAGNOSIS — N949 Unspecified condition associated with female genital organs and menstrual cycle: Secondary | ICD-10-CM

## 2013-09-04 NOTE — Progress Notes (Signed)
Patient ID: Christina Sweeney, female   DOB: 09/02/1966, 47 y.o.   MRN: [id number] Presents for ultrasound. Myomectomy 2004, fibroid embolization at [facility] 04/2012. Cycles are now 1-2 days moderate flow and manageable. Dysmenorrhea minimal. Not sexually active. Mother history of ovarian cancer. Largest fibroid prior to embolization 87 x 78 x 39 mm.  Ultrasound: Multiple fibroids seen 38 x 31 mm calcified, 12 x 10 mm, 25 x 18 mm, of 25 x 12 mm. Ovaries appear normal. No free fluid noted.  Fibroid uterus with good relief after embolization  Plan: Ultrasound reviewed. Condoms and sexually active, continue healthy lifestyle.

## 2013-09-25 ENCOUNTER — Other Ambulatory Visit: Payer: Self-pay | Admitting: Women's Health

## 2013-10-22 ENCOUNTER — Other Ambulatory Visit: Payer: Self-pay

## 2013-11-10 ENCOUNTER — Other Ambulatory Visit: Payer: Self-pay

## 2013-11-10 DIAGNOSIS — Z1231 Encounter for screening mammogram for malignant neoplasm of breast: Secondary | ICD-10-CM

## 2013-12-28 ENCOUNTER — Ambulatory Visit
Admission: RE | Admit: 2013-12-28 | Discharge: 2013-12-28 | Disposition: A | Discharge status: Home / Self Care | Payer: Federal, State, Local not specified - PPO | Source: Ambulatory Visit

## 2013-12-28 DIAGNOSIS — Z1231 Encounter for screening mammogram for malignant neoplasm of breast: Secondary | ICD-10-CM

## 2014-08-24 ENCOUNTER — Ambulatory Visit (INDEPENDENT_AMBULATORY_CARE_PROVIDER_SITE_OTHER): Payer: Federal, State, Local not specified - PPO | Admitting: Women's Health

## 2014-08-24 ENCOUNTER — Encounter: Payer: Self-pay | Admitting: Women's Health

## 2014-08-24 VITALS — BP 118/78 | Ht 63.0 in | Wt 162.8 lb

## 2014-08-24 DIAGNOSIS — B009 Herpesviral infection, unspecified: Secondary | ICD-10-CM

## 2014-08-24 DIAGNOSIS — Z113 Encounter for screening for infections with a predominantly sexual mode of transmission: Secondary | ICD-10-CM

## 2014-08-24 DIAGNOSIS — Z01419 Encounter for gynecological examination (general) (routine) without abnormal findings: Secondary | ICD-10-CM

## 2014-08-24 MED ORDER — VALACYCLOVIR HCL 500 MG PO TABS: 500.0000 mg | ORAL_TABLET | Freq: Two times a day (BID) | ORAL | Status: DC

## 2014-08-24 NOTE — Progress Notes (Signed)
Christina Sweeney [birth year] [id number]    History:    Presents for annual exam.  Monthly cycle with light flow.  Normal Pap and mammogram history. 2004 myomectomy, 2013 uterine embolization at [facility], cycles much lighter after. HSV rare outbreaks. Hypertension primary care manages. 2014 fibroids 49 x 49mm, 12 x 21mm, 49 x 49mm, 49 x 49mm.  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 New partner, full custody of 2 daughters 10 and 93.  ROS:  A  12 point ROS was performed and pertinent positives and negatives are included.  Exam:  Filed Vitals:   08/24/14 0943  BP: 118/78    General appearance:  Normal Thyroid:  Symmetrical, normal in size, without palpable masses or nodularity. Respiratory  Auscultation:  Clear without wheezing or rhonchi Cardiovascular  Auscultation:  Regular rate, without rubs, murmurs or gallops  Edema/varicosities:  Not grossly evident Abdominal  Soft,nontender, without masses, guarding or rebound.  Liver/spleen:  No organomegaly noted  Hernia:  None appreciated  Skin  Inspection:  Grossly normal   Breasts: Examined lying and sitting.     Right: Without masses, retractions, discharge or axillary adenopathy.     Left: Without masses, retractions, discharge or axillary adenopathy. Gentitourinary   Inguinal/mons:  Normal without inguinal adenopathy  External genitalia:  Normal  BUS/Urethra/Skene's glands:  Normal  Vagina:  Normal  Cervix:  Normal  Uterus:   12 weeks size uterus fibroids  Midline and mobile  Adnexa/parametria:     Rt: Without masses or tenderness.   Lt: Without masses or tenderness.  Anus and perineum: Normal  Digital rectal exam: Normal sphincter tone without palpated masses or tenderness  Assessment/Plan:  48 y.o. SBF G0 for annual exam with no  complaints.  Fibroid uterus/embolization 2013 - Regular monthly cycle/condoms STD screen Hypertension-primary care manages meds and labs HSV rare outbreaks  Plan: Valtrex 500 twice daily as needed, prescription, proper use given and reviewed. SBE's, continue 3 D tomography history of density in breast. continue regular exercise, calcium rich diet, vitamin D encouraged. No Pap, Pap normal 2014. GC/Chlamydia, HIV, hep B, C., RPR.     Note: This dictation was prepared with Dragon/digital dictation.  Any transcriptional errors that result are unintentional. Huel Cote [facility], 10:54 AM 08/24/2014

## 2014-08-24 NOTE — Patient Instructions (Signed)

## 2014-08-25 ENCOUNTER — Encounter: Payer: Self-pay | Admitting: Women's Health

## 2014-08-25 LAB — HEPATITIS B SURFACE ANTIGEN: Hepatitis B Surface Ag: NEGATIVE

## 2014-08-25 LAB — HIV ANTIBODY (ROUTINE TESTING W REFLEX): HIV 1&2 Ab, 4th Generation: NONREACTIVE

## 2014-08-25 LAB — GC/CHLAMYDIA PROBE AMP
CT PROBE, AMP APTIMA: NEGATIVE
GC Probe RNA: NEGATIVE

## 2014-08-25 LAB — HEPATITIS C ANTIBODY: HCV AB: NEGATIVE

## 2014-08-25 LAB — RPR

## 2014-12-14 ENCOUNTER — Other Ambulatory Visit: Payer: Self-pay

## 2014-12-14 DIAGNOSIS — Z1231 Encounter for screening mammogram for malignant neoplasm of breast: Secondary | ICD-10-CM

## 2014-12-31 ENCOUNTER — Ambulatory Visit
Admission: RE | Admit: 2014-12-31 | Discharge: 2014-12-31 | Disposition: A | Discharge status: Home / Self Care | Payer: Federal, State, Local not specified - PPO | Source: Ambulatory Visit

## 2014-12-31 DIAGNOSIS — Z1231 Encounter for screening mammogram for malignant neoplasm of breast: Secondary | ICD-10-CM

## 2015-09-02 ENCOUNTER — Other Ambulatory Visit (HOSPITAL_COMMUNITY)
Admission: RE | Admit: 2015-09-02 | Discharge: 2015-09-02 | Disposition: A | Discharge status: Home / Self Care | Payer: Federal, State, Local not specified - PPO | Source: Ambulatory Visit | Attending: Women's Health | Admitting: Women's Health

## 2015-09-02 ENCOUNTER — Encounter: Payer: Self-pay | Admitting: Women's Health

## 2015-09-02 ENCOUNTER — Ambulatory Visit (INDEPENDENT_AMBULATORY_CARE_PROVIDER_SITE_OTHER): Payer: Federal, State, Local not specified - PPO | Admitting: Women's Health

## 2015-09-02 VITALS — BP 130/80 | Ht 63.0 in | Wt 177.0 lb

## 2015-09-02 DIAGNOSIS — Z01419 Encounter for gynecological examination (general) (routine) without abnormal findings: Secondary | ICD-10-CM | POA: Diagnosis present

## 2015-09-02 DIAGNOSIS — Z1151 Encounter for screening for human papillomavirus (HPV): Secondary | ICD-10-CM | POA: Diagnosis present

## 2015-09-02 NOTE — Progress Notes (Signed)
Christina Sweeney 10-Nov-1966 [id number]    History:    Presents for annual exam. Monthly light cycle. Normal Pap and mammogram history. 2000 for myomectomy. 2013 uterine embolization cycles lighter after. 2014 ultrasound fibroids 38 x 31 mm, 12 x 10 mm, 25 x 18 mm, 25 x 12 mm. hypertension primary care manages labs and meds. Same partner with negative STD screen. HSV with rare outbreaks.  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 Ovarian cancer diagnosed at age 53 died at 31.  ROS:  A ROS was performed and pertinent positives and negatives are included.  Exam:  Filed Vitals:   09/02/15 0849  BP: 130/80    General appearance:  Normal Thyroid:  Symmetrical, normal in size, without palpable masses or nodularity. Respiratory  Auscultation:  Clear without wheezing or rhonchi Cardiovascular  Auscultation:  Regular rate, without rubs, murmurs or gallops  Edema/varicosities:  Not grossly evident Abdominal  Soft,nontender, without masses, guarding or rebound.  Liver/spleen:  No organomegaly noted  Hernia:  None appreciated  Skin  Inspection:  Grossly normal   Breasts: Examined lying and sitting.     Right: Without masses, retractions, discharge or axillary adenopathy.     Left: Without masses, retractions, discharge or axillary adenopathy. Gentitourinary   Inguinal/mons:  Normal without inguinal adenopathy  External genitalia:  Normal  BUS/Urethra/Skene's glands:  Normal  Vagina:  Normal  Cervix:  Normal  Uterus: lobular, 8 weeks size, shape and contour.  Midline and mobile  Adnexa/parametria:     Rt: Without masses or tenderness.   Lt: Without masses or tenderness.  Anus and perineum: Normal  Digital rectal exam: Normal sphincter tone without palpated masses or tenderness  Assessment/Plan:  49 y.o. SBF G0 for annual exam.    Light monthly  cycle Fibroids- history of myomectomy and embolization Hypertension primary care manages labs and meds HSV rare outbreaks  Plan: Menopause reviewed currently no symptoms, declines contraception. SBE's, continue annual 3-D screening mammogram history of dense breasts. Continue exercise, active lifestyle, decrease calories for weight loss. UA, Pap with HR HPV typing, new screening guidelines reviewed. Screening colonoscopy discussed and encouraged, will schedule. Valtrex 500 twice daily for 3-5 days if needed, prescription given.    Huel Cote WHNP, 1:18 PM 09/02/2015

## 2015-09-02 NOTE — Patient Instructions (Signed)
Diabetes Mellitus and Food It is important for you to manage your blood sugar (glucose) level. Your blood glucose level can be greatly affected by what you eat. Eating healthier foods in the appropriate amounts throughout the day at about the same time each day will help you control your blood glucose level. It can also help slow or prevent worsening of your diabetes mellitus. Healthy eating may even help you improve the level of your blood pressure and reach or maintain a healthy weight.  HOW CAN FOOD AFFECT ME? Carbohydrates Carbohydrates affect your blood glucose level more than any other type of food. Your dietitian will help you determine how many carbohydrates to eat at each meal and teach you how to count carbohydrates. Counting carbohydrates is important to keep your blood glucose at a healthy level, especially if you are using insulin or taking certain medicines for diabetes mellitus. Alcohol Alcohol can cause sudden decreases in blood glucose (hypoglycemia), especially if you use insulin or take certain medicines for diabetes mellitus. Hypoglycemia can be a life-threatening condition. Symptoms of hypoglycemia (sleepiness, dizziness, and disorientation) are similar to symptoms of having too much alcohol.  If your health care Christina Sweeney has given you approval to drink alcohol, do so in moderation and use the following guidelines:  Women should not have more than one drink per day, and men should not have more than two drinks per day. One drink is equal to:  12 oz of beer.  5 oz of wine.  1 oz of hard liquor.  Do not drink on an empty stomach.  Keep yourself hydrated. Have water, diet soda, or unsweetened iced tea.  Regular soda, juice, and other mixers might contain a lot of carbohydrates and should be counted. WHAT FOODS ARE NOT RECOMMENDED? As you make food choices, it is important to remember that all foods are not the same. Some foods have fewer nutrients per serving than other  foods, even though they might have the same number of calories or carbohydrates. It is difficult to get your body what it needs when you eat foods with fewer nutrients. Examples of foods that you should avoid that are high in calories and carbohydrates but low in nutrients include:  Trans fats (most processed foods list trans fats on the Nutrition Facts label).  Regular soda.  Juice.  Candy.  Sweets, such as cake, pie, doughnuts, and cookies.  Fried foods. WHAT FOODS CAN I EAT? Have nutrient-rich foods, which will nourish your body and keep you healthy. The food you should eat also will depend on several factors, including:  The calories you need.  The medicines you take.  Your weight.  Your blood glucose level.  Your blood pressure level.  Your cholesterol level. You also should eat a variety of foods, including:  Protein, such as meat, poultry, fish, tofu, nuts, and seeds (lean animal proteins are best).  Fruits.  Vegetables.  Dairy products, such as milk, cheese, and yogurt (low fat is best).  Breads, grains, pasta, cereal, rice, and beans.  Fats such as olive oil, trans fat-free margarine, canola oil, avocado, and olives. DOES EVERYONE WITH DIABETES MELLITUS HAVE THE SAME MEAL PLAN? Because every person with diabetes mellitus is different, there is not one meal plan that works for everyone. It is very important that you meet with a dietitian who will help you create a meal plan that is just right for you. Document Released: 08/30/2005 Document Revised: 12/08/2013 Document Reviewed: 10/30/2013 ExitCare Patient Information 2015 [employer]. This   information is not intended to replace advice given to you by your health care Christina Sweeney. Make sure you discuss any questions you have with your health care Christina Sweeney.  

## 2015-09-03 LAB — URINALYSIS W MICROSCOPIC + REFLEX CULTURE
Bacteria, UA: NONE SEEN [HPF]
Bilirubin Urine: NEGATIVE
Casts: NONE SEEN [LPF]
Crystals: NONE SEEN [HPF]
Glucose, UA: NEGATIVE
HGB URINE DIPSTICK: NEGATIVE
Ketones, ur: NEGATIVE
LEUKOCYTES UA: NEGATIVE
NITRITE: NEGATIVE
PH: 6 (ref 5.0–8.0)
Protein, ur: NEGATIVE
RBC / HPF: NONE SEEN RBC/HPF (ref <=2)
SQUAMOUS EPITHELIAL / LPF: NONE SEEN [HPF] (ref <=5)
Specific Gravity, Urine: 1.013 (ref 1.001–1.035)
WBC, UA: NONE SEEN WBC/HPF (ref <=5)
YEAST: NONE SEEN [HPF]

## 2015-09-05 ENCOUNTER — Encounter: Payer: Self-pay | Admitting: Women's Health

## 2015-09-05 DIAGNOSIS — B009 Herpesviral infection, unspecified: Secondary | ICD-10-CM

## 2015-09-05 LAB — CYTOLOGY - PAP

## 2015-09-09 MED ORDER — VALACYCLOVIR HCL 500 MG PO TABS: ORAL_TABLET | ORAL | Status: DC

## 2015-12-21 ENCOUNTER — Other Ambulatory Visit: Payer: Self-pay

## 2015-12-21 DIAGNOSIS — Z1231 Encounter for screening mammogram for malignant neoplasm of breast: Secondary | ICD-10-CM

## 2016-01-06 ENCOUNTER — Ambulatory Visit
Admission: RE | Admit: 2016-01-06 | Discharge: 2016-01-06 | Disposition: A | Discharge status: Home / Self Care | Payer: Federal, State, Local not specified - PPO | Source: Ambulatory Visit

## 2016-01-06 DIAGNOSIS — Z1231 Encounter for screening mammogram for malignant neoplasm of breast: Secondary | ICD-10-CM

## 2016-03-23 DIAGNOSIS — J329 Chronic sinusitis, unspecified: Secondary | ICD-10-CM | POA: Diagnosis not present

## 2016-07-06 DIAGNOSIS — K080 Exfoliation of teeth due to systemic causes: Secondary | ICD-10-CM | POA: Diagnosis not present

## 2016-09-04 ENCOUNTER — Ambulatory Visit (INDEPENDENT_AMBULATORY_CARE_PROVIDER_SITE_OTHER): Payer: Federal, State, Local not specified - PPO | Admitting: Women's Health

## 2016-09-04 ENCOUNTER — Encounter: Payer: Self-pay | Admitting: Women's Health

## 2016-09-04 VITALS — BP 122/83 | Ht 64.0 in | Wt 183.8 lb

## 2016-09-04 DIAGNOSIS — B009 Herpesviral infection, unspecified: Secondary | ICD-10-CM

## 2016-09-04 DIAGNOSIS — Z01419 Encounter for gynecological examination (general) (routine) without abnormal findings: Secondary | ICD-10-CM | POA: Diagnosis not present

## 2016-09-04 MED ORDER — VALACYCLOVIR HCL 500 MG PO TABS: ORAL_TABLET | ORAL | 11 refills | Status: DC

## 2016-09-04 NOTE — Progress Notes (Signed)
Christina Sweeney 19-Aug-1966 [id number]    History:    Presents for annual exam. Monthly light cycle. Denies vasomotor symptoms. Normal Pap and mammogram history. Denies need for STD screen/ not active. 2000 myomectomy, 2013 uterine embolization, light cycles since then. 2014 U/S for fibroids, 38 x 31 mm, 12 x 10 mm, 25 x 18 mm, 25 x 12 mm. Complains of abdominal distention with eating, on high protein diet, increased constipation. Rare blood in stool, associated with straining. Has not had screening colonoscopy yet. Also complains of increased urinary urgency and frequency. Denies dysuria, hematuria. Hypertension managed by PCP, well controlled on Norvasc. No recent HSV outbreaks. States mother did not reach menopause until age 15, diagnosed with ovarian cancer at age 66, and died at age 103.   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 Active lifestyle. Continues to see past partner's 2 daughters every other weekend.  ROS:  A ROS was performed and pertinent positives and negatives are included.  Exam:  Vitals:   09/04/16 0915  BP: 122/83  Weight: 183 lb 12.8 oz (83.4 kg)  Height: 5\' 4"  (1.626 m)   Body mass index is 31.55 kg/m.   General appearance:  Normal Thyroid:  Symmetrical, normal in size, without palpable masses or nodularity. Respiratory  Auscultation:  Clear without wheezing or rhonchi Cardiovascular  Auscultation:  Regular rate, without rubs, murmurs or gallops  Edema/varicosities:  Not grossly evident Abdominal  Soft,nontender, without masses, guarding or rebound.  Liver/spleen:  No organomegaly noted  Hernia:  None appreciated  Skin  Inspection:  Grossly normal   Breasts: Examined lying and sitting.     Right: Without masses, retractions, discharge or axillary adenopathy.     Left: Without masses, retractions, discharge or axillary  adenopathy. Gentitourinary   Inguinal/mons:  Normal without inguinal adenopathy  External genitalia:  Normal  BUS/Urethra/Skene's glands:  Normal  Vagina:  Normal  Cervix:  Normal  Uterus:  Enlarged from fibroids, ~12 weeks size.  Midline and mobile  Adnexa/parametria:     Rt: Without masses or tenderness.   Lt: Without masses or tenderness.  Anus and perineum: Normal  Digital rectal exam: Normal sphincter tone without palpated masses or tenderness  Assessment/Plan:  50 y.o. SBF G0 for annual exam with complaints of abdominal bloating associated with constipation and increased urinary frequency and urgency.  Light monthly cycle Fibroids -- myomectomy 2000, embolization 2013 HTN -- labs and meds managed by PCP Abdominal bloating/constipation Urinary symptoms HSV rare outbreaks   Plan: Menopause signs and symptoms reviewed, currently no symptoms, declines contraception, encouraged condom use when becoming sexually active. Encouraged SBEs, annual 3-D screening mammogram d/t dense breasts. Continue active lifestyle and exercise. Decrease protein in diet to <100 g daily, 67gm optimal,  increase fiber in diet, schedule screening colonoscopy. [facility] information given. Pelvic ultrasound if continued problems with bloating/constipation. Encouraged daily multivitamin. Explained urinary symptoms likely due to pressure on bladder from fibroids. Instructed to call office if symptoms change or worsen. Valtrex 500 twice daily for 3-5 days to be used PRN, prescription given. UA. Pap normal with negative HR HPV 2016, new screening guidelines reviewed.   Huel Cote WHNP, 10:02 AM 09/04/2016

## 2016-09-04 NOTE — Patient Instructions (Signed)
Colonoscopy  Dr Carlean Purl   [phone]  Menopause is a normal process in which your reproductive ability comes to an end. This process happens gradually over a span of months to years, usually between the ages of 49 and 73. Menopause is complete when you have missed 12 consecutive menstrual periods. It is important to talk with your health care provider about some of the most common conditions that affect postmenopausal women, such as heart disease, cancer, and bone loss (osteoporosis). Adopting a healthy lifestyle and getting preventive care can help to promote your health and wellness. Those actions can also lower your chances of developing some of these common conditions. WHAT SHOULD I KNOW ABOUT MENOPAUSE? During menopause, you may experience a number of symptoms, such as:  Moderate-to-severe hot flashes.  Night sweats.  Decrease in sex drive.  Mood swings.  Headaches.  Tiredness.  Irritability.  Memory problems.  Insomnia. Choosing to treat or not to treat menopausal changes is an individual decision that you make with your health care provider. WHAT SHOULD I KNOW ABOUT HORMONE REPLACEMENT THERAPY AND SUPPLEMENTS? Hormone therapy products are effective for treating symptoms that are associated with menopause, such as hot flashes and night sweats. Hormone replacement carries certain risks, especially as you become older. If you are thinking about using estrogen or estrogen with progestin treatments, discuss the benefits and risks with your health care provider. WHAT SHOULD I KNOW ABOUT HEART DISEASE AND STROKE? Heart disease, heart attack, and stroke become more likely as you age. This may be due, in part, to the hormonal changes that your body experiences during menopause. These can affect how your body processes dietary fats, triglycerides, and cholesterol. Heart attack and stroke are both medical emergencies. There are many things that you can do to help prevent heart disease and  stroke:  Have your blood pressure checked at least every 1-2 years. High blood pressure causes heart disease and increases the risk of stroke.  If you are 73-7 years old, ask your health care provider if you should take aspirin to prevent a heart attack or a stroke.  Do not use any tobacco products, including cigarettes, chewing tobacco, or electronic cigarettes. If you need help quitting, ask your health care provider.  It is important to eat a healthy diet and maintain a healthy weight.  Be sure to include plenty of vegetables, fruits, low-fat dairy products, and lean protein.  Avoid eating foods that are high in solid fats, added sugars, or salt (sodium).  Get regular exercise. This is one of the most important things that you can do for your health.  Try to exercise for at least 150 minutes each week. The type of exercise that you do should increase your heart rate and make you sweat. This is known as moderate-intensity exercise.  Try to do strengthening exercises at least twice each week. Do these in addition to the moderate-intensity exercise.  Know your numbers.Ask your health care provider to check your cholesterol and your blood glucose. Continue to have your blood tested as directed by your health care provider. WHAT SHOULD I KNOW ABOUT CANCER SCREENING? There are several types of cancer. Take the following steps to reduce your risk and to catch any cancer development as early as possible. Breast Cancer  Practice breast self-awareness.  This means understanding how your breasts normally appear and feel.  It also means doing regular breast self-exams. Let your health care provider know about any changes, no matter how small.  If you are  40 or older, have a clinician do a breast exam (clinical breast exam or CBE) every year. Depending on your age, family history, and medical history, it may be recommended that you also have a yearly breast X-ray (mammogram).  If you  have a family history of breast cancer, talk with your health care provider about genetic screening.  If you are at high risk for breast cancer, talk with your health care provider about having an MRI and a mammogram every year.  Breast cancer (BRCA) gene test is recommended for women who have family members with BRCA-related cancers. Results of the assessment will determine the need for genetic counseling and BRCA1 and for BRCA2 testing. BRCA-related cancers include these types:  Breast. This occurs in males or females.  Ovarian.  Tubal. This may also be called fallopian tube cancer.  Cancer of the abdominal or pelvic lining (peritoneal cancer).  Prostate.  Pancreatic. Cervical, Uterine, and Ovarian Cancer Your health care provider may recommend that you be screened regularly for cancer of the pelvic organs. These include your ovaries, uterus, and vagina. This screening involves a pelvic exam, which includes checking for microscopic changes to the surface of your cervix (Pap test).  For women ages 21-65, health care providers may recommend a pelvic exam and a Pap test every three years. For women ages 30-65, they may recommend the Pap test and pelvic exam, combined with testing for human papilloma virus (HPV), every five years. Some types of HPV increase your risk of cervical cancer. Testing for HPV may also be done on women of any age who have unclear Pap test results.  Other health care providers may not recommend any screening for nonpregnant women who are considered low risk for pelvic cancer and have no symptoms. Ask your health care provider if a screening pelvic exam is right for you.  If you have had past treatment for cervical cancer or a condition that could lead to cancer, you need Pap tests and screening for cancer for at least 20 years after your treatment. If Pap tests have been discontinued for you, your risk factors (such as having a new sexual partner) need to be reassessed  to determine if you should start having screenings again. Some women have medical problems that increase the chance of getting cervical cancer. In these cases, your health care provider may recommend that you have screening and Pap tests more often.  If you have a family history of uterine cancer or ovarian cancer, talk with your health care provider about genetic screening.  If you have vaginal bleeding after reaching menopause, tell your health care provider.  There are currently no reliable tests available to screen for ovarian cancer. Lung Cancer Lung cancer screening is recommended for adults 55-80 years old who are at high risk for lung cancer because of a history of smoking. A yearly low-dose CT scan of the lungs is recommended if you:  Currently smoke.  Have a history of at least 30 pack-years of smoking and you currently smoke or have quit within the past 15 years. A pack-year is smoking an average of one pack of cigarettes per day for one year. Yearly screening should:  Continue until it has been 15 years since you quit.  Stop if you develop a health problem that would prevent you from having lung cancer treatment. Colorectal Cancer  This type of cancer can be detected and can often be prevented.  Routine colorectal cancer screening usually begins at age 50 and   continues through age 75.  If you have risk factors for colon cancer, your health care provider may recommend that you be screened at an earlier age.  If you have a family history of colorectal cancer, talk with your health care provider about genetic screening.  Your health care provider may also recommend using home test kits to check for hidden blood in your stool.  A small camera at the end of a tube can be used to examine your colon directly (sigmoidoscopy or colonoscopy). This is done to check for the earliest forms of colorectal cancer.  Direct examination of the colon should be repeated every 5-10 years until  age 75. However, if early forms of precancerous polyps or small growths are found or if you have a family history or genetic risk for colorectal cancer, you may need to be screened more often. Skin Cancer  Check your skin from head to toe regularly.  Monitor any moles. Be sure to tell your health care provider:  About any new moles or changes in moles, especially if there is a change in a mole's shape or color.  If you have a mole that is larger than the size of a pencil eraser.  If any of your family members has a history of skin cancer, especially at a [place] age, talk with your health care provider about genetic screening.  Always use sunscreen. Apply sunscreen liberally and repeatedly throughout the day.  Whenever you are outside, protect yourself by wearing long sleeves, pants, a wide-brimmed hat, and sunglasses. WHAT SHOULD I KNOW ABOUT OSTEOPOROSIS? Osteoporosis is a condition in which bone destruction happens more quickly than new bone creation. After menopause, you may be at an increased risk for osteoporosis. To help prevent osteoporosis or the bone fractures that can happen because of osteoporosis, the following is recommended:  If you are 19-50 years old, get at least 1,000 mg of calcium and at least 600 mg of vitamin D per day.  If you are older than age 50 but younger than age 70, get at least 1,200 mg of calcium and at least 600 mg of vitamin D per day.  If you are older than age 70, get at least 1,200 mg of calcium and at least 800 mg of vitamin D per day. Smoking and excessive alcohol intake increase the risk of osteoporosis. Eat foods that are rich in calcium and vitamin D, and do weight-bearing exercises several times each week as directed by your health care provider. WHAT SHOULD I KNOW ABOUT HOW MENOPAUSE AFFECTS MY MENTAL HEALTH? Depression may occur at any age, but it is more common as you become older. Common symptoms of depression include:  Low or sad  mood.  Changes in sleep patterns.  Changes in appetite or eating patterns.  Feeling an overall lack of motivation or enjoyment of activities that you previously enjoyed.  Frequent crying spells. Talk with your health care provider if you think that you are experiencing depression. WHAT SHOULD I KNOW ABOUT IMMUNIZATIONS? It is important that you get and maintain your immunizations. These include:  Tetanus, diphtheria, and pertussis (Tdap) booster vaccine.  Influenza every year before the flu season begins.  Pneumonia vaccine.  Shingles vaccine. Your health care provider may also recommend other immunizations.   This information is not intended to replace advice given to you by your health care provider. Make sure you discuss any questions you have with your health care provider.   Document Released: 01/25/2006 Document Revised: 12/24/2014 Document   Reviewed: 08/05/2014 Elsevier Interactive Patient Education [employer].

## 2016-09-05 LAB — URINALYSIS W MICROSCOPIC + REFLEX CULTURE
BACTERIA UA: NONE SEEN [HPF]
BILIRUBIN URINE: NEGATIVE
CASTS: NONE SEEN [LPF]
CRYSTALS: NONE SEEN [HPF]
Glucose, UA: NEGATIVE
Hgb urine dipstick: NEGATIVE
Ketones, ur: NEGATIVE
Leukocytes, UA: NEGATIVE
Nitrite: NEGATIVE
PROTEIN: NEGATIVE
RBC / HPF: NONE SEEN RBC/HPF (ref <=2)
Specific Gravity, Urine: 1.016 (ref 1.001–1.035)
Squamous Epithelial / LPF: NONE SEEN [HPF] (ref <=5)
WBC, UA: NONE SEEN WBC/HPF (ref <=5)
YEAST: NONE SEEN [HPF]
pH: 6 (ref 5.0–8.0)

## 2016-11-12 ENCOUNTER — Encounter: Payer: Self-pay | Admitting: Internal Medicine

## 2016-11-19 ENCOUNTER — Ambulatory Visit (AMBULATORY_SURGERY_CENTER): Payer: Self-pay | Admitting: *Deleted

## 2016-11-19 VITALS — Ht 63.0 in | Wt 184.0 lb

## 2016-11-19 DIAGNOSIS — Z1211 Encounter for screening for malignant neoplasm of colon: Secondary | ICD-10-CM

## 2016-11-19 NOTE — Progress Notes (Signed)
No egg or soy allergy known to patient  No issues with past sedation with any surgeries  or procedures but had PONV with 1st hysteroscopy  no intubation problems  No diet pills per patient No home 02 use per patient  No blood thinners per patient  Pt denies issues with constipation  No A fib or A flutter   emmi declined

## 2016-11-23 DIAGNOSIS — R7301 Impaired fasting glucose: Secondary | ICD-10-CM | POA: Diagnosis not present

## 2016-11-23 DIAGNOSIS — Z713 Dietary counseling and surveillance: Secondary | ICD-10-CM | POA: Diagnosis not present

## 2016-11-23 DIAGNOSIS — K5900 Constipation, unspecified: Secondary | ICD-10-CM | POA: Diagnosis not present

## 2016-11-23 DIAGNOSIS — I10 Essential (primary) hypertension: Secondary | ICD-10-CM | POA: Diagnosis not present

## 2016-11-27 ENCOUNTER — Encounter: Payer: Self-pay | Admitting: Internal Medicine

## 2016-11-27 ENCOUNTER — Ambulatory Visit (AMBULATORY_SURGERY_CENTER): Payer: Federal, State, Local not specified - PPO | Admitting: Internal Medicine

## 2016-11-27 VITALS — BP 104/62 | HR 86 | Temp 97.8°F | Resp 12 | Ht 63.0 in | Wt 184.0 lb

## 2016-11-27 DIAGNOSIS — Z1211 Encounter for screening for malignant neoplasm of colon: Secondary | ICD-10-CM

## 2016-11-27 DIAGNOSIS — Z1212 Encounter for screening for malignant neoplasm of rectum: Secondary | ICD-10-CM | POA: Diagnosis not present

## 2016-11-27 MED ORDER — SODIUM CHLORIDE 0.9 % IV SOLN: 500.0000 mL | INTRAVENOUS | Status: DC

## 2016-11-27 NOTE — Progress Notes (Signed)
To recovery VSS Report To Production assistant, radio

## 2016-11-27 NOTE — Op Note (Addendum)
[facility] Patient Name: Christina Sweeney Procedure Date: 11/27/2016 8:53 AM MRN: [id number] Endoscopist: Gatha Mayer , MD Age: 50 Referring MD:  Date of Birth: 1966-02-25 Gender: Female Account #: [id number] Procedure:                Colonoscopy Indications:              Screening for colorectal malignant neoplasm, This                            is the patient's first colonoscopy Medicines:                Propofol per Anesthesia, Monitored Anesthesia Care Procedure:                Pre-Anesthesia Assessment:                           - Prior to the procedure, a History and Physical                            was performed, and patient medications and                            allergies were reviewed. The patient's tolerance of                            previous anesthesia was also reviewed. The risks                            and benefits of the procedure and the sedation                            options and risks were discussed with the patient.                            All questions were answered, and informed consent                            was obtained. Prior Anticoagulants: The patient has                            taken no previous anticoagulant or antiplatelet                            agents. ASA Grade Assessment: II - A patient with                            mild systemic disease. After reviewing the risks                            and benefits, the patient was deemed in                            satisfactory condition to undergo the procedure.  After obtaining informed consent, the colonoscope                            was passed under direct vision. Throughout the                            procedure, the patient's blood pressure, pulse, and                            oxygen saturations were monitored continuously. The                            Model CF-HQ190L [id number]) scope was introduced   through the anus and advanced to the the cecum,                            identified by appendiceal orifice and ileocecal                            valve. The colonoscopy was performed without                            difficulty. The patient tolerated the procedure                            well. The quality of the bowel preparation was                            excellent. The bowel preparation used was Miralax.                            The ileocecal valve, appendiceal orifice, and                            rectum were photographed. Scope In: 8:57:31 AM Scope Out: 9:09:01 AM Scope Withdrawal Time: 0 hours 8 minutes 55 seconds  Total Procedure Duration: 0 hours 11 minutes 30 seconds  Findings:                 The perianal and digital rectal examinations were                            normal.                           The entire examined colon appeared normal on direct                            and retroflexion views. Complications:            No immediate complications. Estimated Blood Loss:     Estimated blood loss: none. Impression:               - The entire examined colon is normal on direct and  retroflexion views.                           - No specimens collected. Recommendation:           - Patient has a contact number available for                            emergencies. The signs and symptoms of potential                            delayed complications were discussed with the                            patient. Return to normal activities tomorrow.                            Written discharge instructions were provided to the                            patient.                           - Resume previous diet.                           - Continue present medications.                           - Repeat colonoscopy/screening test in 10 years for                            screening purposes. Gatha Mayer, MD 11/27/2016 9:13:34 AM This  report has been signed electronically.

## 2016-11-27 NOTE — Patient Instructions (Addendum)
   Your colonoscopy was normal.  Next routine colonoscopy/screening test in 10 years - 2027-8.  I appreciate the opportunity to care for you. Gatha Mayer, MD, FACG  YOU HAD AN ENDOSCOPIC PROCEDURE TODAY AT [facility]:   Refer to the procedure report that was given to you for any specific questions about what was found during the examination.  If the procedure report does not answer your questions, please call your gastroenterologist to clarify.  If you requested that your care partner not be given the details of your procedure findings, then the procedure report has been included in a sealed envelope for you to review at your convenience later.  YOU SHOULD EXPECT: Some feelings of bloating in the abdomen. Passage of more gas than usual.  Walking can help get rid of the air that was put into your GI tract during the procedure and reduce the bloating. If you had a lower endoscopy (such as a colonoscopy or flexible sigmoidoscopy) you may notice spotting of blood in your stool or on the toilet paper. If you underwent a bowel prep for your procedure, you may not have a normal bowel movement for a few days.  Please Note:  You might notice some irritation and congestion in your nose or some drainage.  This is from the oxygen used during your procedure.  There is no need for concern and it should clear up in a day or so.  SYMPTOMS TO REPORT IMMEDIATELY:   Following lower endoscopy (colonoscopy or flexible sigmoidoscopy):  Excessive amounts of blood in the stool  Significant tenderness or worsening of abdominal pains  Swelling of the abdomen that is new, acute  Fever of 100F or higher  For urgent or emergent issues, a gastroenterologist can be reached at any hour by calling [phone].   DIET:  We do recommend a small meal at first, but then you may proceed to your regular diet.  Drink plenty of fluids but you should avoid alcoholic beverages for 24  hours.  ACTIVITY:  You should plan to take it easy for the rest of today and you should NOT DRIVE or use heavy machinery until tomorrow (because of the sedation medicines used during the test).    FOLLOW UP: Our staff will call the number listed on your records the next business day following your procedure to check on you and address any questions or concerns that you may have regarding the information given to you following your procedure. If we do not reach you, we will leave a message.  However, if you are feeling well and you are not experiencing any problems, there is no need to return our call.  We will assume that you have returned to your regular daily activities without incident.  If any biopsies were taken you will be contacted by phone or by letter within the next 1-3 weeks.  Please call us at [phone] if you have not heard about the biopsies in 3 weeks.    SIGNATURES/CONFIDENTIALITY: You and/or your care partner have signed paperwork which will be entered into your electronic medical record.  These signatures attest to the fact that that the information above on your After Visit Summary has been reviewed and is understood.  Full responsibility of the confidentiality of this discharge information lies with you and/or your care-partner.  Thank you for letting us take care of your healthcare needs today.

## 2016-11-28 ENCOUNTER — Telehealth: Payer: Self-pay | Admitting: *Deleted

## 2016-11-28 NOTE — Telephone Encounter (Signed)
  Follow up Call-  Call back number 11/27/2016  Post procedure Call Back phone  # [phone]  Permission to leave phone message Yes  Some recent data might be hidden     Patient questions:  Do you have a fever, pain , or abdominal swelling? No. Pain Score  0 *  Have you tolerated food without any problems? Yes.    Have you been able to return to your normal activities? Yes.    Do you have any questions about your discharge instructions: Diet   No. Medications  No. Follow up visit  No.  Do you have questions or concerns about your Care? No.  Actions: * If pain score is 4 or above: No action needed, pain <4.

## 2017-01-11 DIAGNOSIS — K080 Exfoliation of teeth due to systemic causes: Secondary | ICD-10-CM | POA: Diagnosis not present

## 2017-01-15 ENCOUNTER — Other Ambulatory Visit: Payer: Self-pay | Admitting: Gynecology

## 2017-01-15 DIAGNOSIS — Z1231 Encounter for screening mammogram for malignant neoplasm of breast: Secondary | ICD-10-CM

## 2017-01-23 ENCOUNTER — Ambulatory Visit
Admission: RE | Admit: 2017-01-23 | Discharge: 2017-01-23 | Disposition: A | Discharge status: Home / Self Care | Payer: Federal, State, Local not specified - PPO | Source: Ambulatory Visit | Attending: Gynecology | Admitting: Gynecology

## 2017-01-23 DIAGNOSIS — Z1231 Encounter for screening mammogram for malignant neoplasm of breast: Secondary | ICD-10-CM

## 2017-02-06 ENCOUNTER — Ambulatory Visit: Payer: Federal, State, Local not specified - PPO

## 2017-05-01 ENCOUNTER — Encounter: Payer: Self-pay | Admitting: Gynecology

## 2017-07-12 DIAGNOSIS — K080 Exfoliation of teeth due to systemic causes: Secondary | ICD-10-CM | POA: Diagnosis not present

## 2017-09-05 ENCOUNTER — Encounter: Payer: Federal, State, Local not specified - PPO | Admitting: Women's Health

## 2017-09-09 ENCOUNTER — Encounter: Payer: Self-pay | Admitting: Women's Health

## 2017-09-09 ENCOUNTER — Ambulatory Visit (INDEPENDENT_AMBULATORY_CARE_PROVIDER_SITE_OTHER): Payer: Federal, State, Local not specified - PPO | Admitting: Women's Health

## 2017-09-09 VITALS — BP 124/80 | Ht 63.0 in | Wt 177.0 lb

## 2017-09-09 DIAGNOSIS — B009 Herpesviral infection, unspecified: Secondary | ICD-10-CM

## 2017-09-09 DIAGNOSIS — Z01419 Encounter for gynecological examination (general) (routine) without abnormal findings: Secondary | ICD-10-CM | POA: Diagnosis not present

## 2017-09-09 MED ORDER — VALACYCLOVIR HCL 500 MG PO TABS: ORAL_TABLET | ORAL | 11 refills | Status: DC

## 2017-09-09 NOTE — Patient Instructions (Signed)
Health Maintenance for Postmenopausal Women Menopause is a normal process in which your reproductive ability comes to an end. This process happens gradually over a span of months to years, usually between the ages of 22 and 9. Menopause is complete when you have missed 12 consecutive menstrual periods. It is important to talk with your health care provider about some of the most common conditions that affect postmenopausal women, such as heart disease, cancer, and bone loss (osteoporosis). Adopting a healthy lifestyle and getting preventive care can help to promote your health and wellness. Those actions can also lower your chances of developing some of these common conditions. What should I know about menopause? During menopause, you may experience a number of symptoms, such as:  Moderate-to-severe hot flashes.  Night sweats.  Decrease in sex drive.  Mood swings.  Headaches.  Tiredness.  Irritability.  Memory problems.  Insomnia.  Choosing to treat or not to treat menopausal changes is an individual decision that you make with your health care provider. What should I know about hormone replacement therapy and supplements? Hormone therapy products are effective for treating symptoms that are associated with menopause, such as hot flashes and night sweats. Hormone replacement carries certain risks, especially as you become older. If you are thinking about using estrogen or estrogen with progestin treatments, discuss the benefits and risks with your health care provider. What should I know about heart disease and stroke? Heart disease, heart attack, and stroke become more likely as you age. This may be due, in part, to the hormonal changes that your body experiences during menopause. These can affect how your body processes dietary fats, triglycerides, and cholesterol. Heart attack and stroke are both medical emergencies. There are many things that you can do to help prevent heart disease  and stroke:  Have your blood pressure checked at least every 1-2 years. High blood pressure causes heart disease and increases the risk of stroke.  If you are 53-22 years old, ask your health care provider if you should take aspirin to prevent a heart attack or a stroke.  Do not use any tobacco products, including cigarettes, chewing tobacco, or electronic cigarettes. If you need help quitting, ask your health care provider.  It is important to eat a healthy diet and maintain a healthy weight. ? Be sure to include plenty of vegetables, fruits, low-fat dairy products, and lean protein. ? Avoid eating foods that are high in solid fats, added sugars, or salt (sodium).  Get regular exercise. This is one of the most important things that you can do for your health. ? Try to exercise for at least 150 minutes each week. The type of exercise that you do should increase your heart rate and make you sweat. This is known as moderate-intensity exercise. ? Try to do strengthening exercises at least twice each week. Do these in addition to the moderate-intensity exercise.  Know your numbers.Ask your health care provider to check your cholesterol and your blood glucose. Continue to have your blood tested as directed by your health care provider.  What should I know about cancer screening? There are several types of cancer. Take the following steps to reduce your risk and to catch any cancer development as early as possible. Breast Cancer  Practice breast self-awareness. ? This means understanding how your breasts normally appear and feel. ? It also means doing regular breast self-exams. Let your health care provider know about any changes, no matter how small.  If you are 40  or older, have a clinician do a breast exam (clinical breast exam or CBE) every year. Depending on your age, family history, and medical history, it may be recommended that you also have a yearly breast X-ray (mammogram).  If you  have a family history of breast cancer, talk with your health care provider about genetic screening.  If you are at high risk for breast cancer, talk with your health care provider about having an MRI and a mammogram every year.  Breast cancer (BRCA) gene test is recommended for women who have family members with BRCA-related cancers. Results of the assessment will determine the need for genetic counseling and BRCA1 and for BRCA2 testing. BRCA-related cancers include these types: ? Breast. This occurs in males or females. ? Ovarian. ? Tubal. This may also be called fallopian tube cancer. ? Cancer of the abdominal or pelvic lining (peritoneal cancer). ? Prostate. ? Pancreatic.  Cervical, Uterine, and Ovarian Cancer Your health care provider may recommend that you be screened regularly for cancer of the pelvic organs. These include your ovaries, uterus, and vagina. This screening involves a pelvic exam, which includes checking for microscopic changes to the surface of your cervix (Pap test).  For women ages 21-65, health care providers may recommend a pelvic exam and a Pap test every three years. For women ages 79-65, they may recommend the Pap test and pelvic exam, combined with testing for human papilloma virus (HPV), every five years. Some types of HPV increase your risk of cervical cancer. Testing for HPV may also be done on women of any age who have unclear Pap test results.  Other health care providers may not recommend any screening for nonpregnant women who are considered low risk for pelvic cancer and have no symptoms. Ask your health care provider if a screening pelvic exam is right for you.  If you have had past treatment for cervical cancer or a condition that could lead to cancer, you need Pap tests and screening for cancer for at least 20 years after your treatment. If Pap tests have been discontinued for you, your risk factors (such as having a new sexual partner) need to be  reassessed to determine if you should start having screenings again. Some women have medical problems that increase the chance of getting cervical cancer. In these cases, your health care provider may recommend that you have screening and Pap tests more often.  If you have a family history of uterine cancer or ovarian cancer, talk with your health care provider about genetic screening.  If you have vaginal bleeding after reaching menopause, tell your health care provider.  There are currently no reliable tests available to screen for ovarian cancer.  Lung Cancer Lung cancer screening is recommended for adults 69-62 years old who are at high risk for lung cancer because of a history of smoking. A yearly low-dose CT scan of the lungs is recommended if you:  Currently smoke.  Have a history of at least 30 pack-years of smoking and you currently smoke or have quit within the past 15 years. A pack-year is smoking an average of one pack of cigarettes per day for one year.  Yearly screening should:  Continue until it has been 15 years since you quit.  Stop if you develop a health problem that would prevent you from having lung cancer treatment.  Colorectal Cancer  This type of cancer can be detected and can often be prevented.  Routine colorectal cancer screening usually begins at  age 42 and continues through age 45.  If you have risk factors for colon cancer, your health care provider may recommend that you be screened at an earlier age.  If you have a family history of colorectal cancer, talk with your health care provider about genetic screening.  Your health care provider may also recommend using home test kits to check for hidden blood in your stool.  A small camera at the end of a tube can be used to examine your colon directly (sigmoidoscopy or colonoscopy). This is done to check for the earliest forms of colorectal cancer.  Direct examination of the colon should be repeated every  5-10 years until age 71. However, if early forms of precancerous polyps or small growths are found or if you have a family history or genetic risk for colorectal cancer, you may need to be screened more often.  Skin Cancer  Check your skin from head to toe regularly.  Monitor any moles. Be sure to tell your health care provider: ? About any new moles or changes in moles, especially if there is a change in a mole's shape or color. ? If you have a mole that is larger than the size of a pencil eraser.  If any of your family members has a history of skin cancer, especially at a Lupe Bonner age, talk with your health care provider about genetic screening.  Always use sunscreen. Apply sunscreen liberally and repeatedly throughout the day.  Whenever you are outside, protect yourself by wearing long sleeves, pants, a wide-brimmed hat, and sunglasses.  What should I know about osteoporosis? Osteoporosis is a condition in which bone destruction happens more quickly than new bone creation. After menopause, you may be at an increased risk for osteoporosis. To help prevent osteoporosis or the bone fractures that can happen because of osteoporosis, the following is recommended:  If you are 46-71 years old, get at least 1,000 mg of calcium and at least 600 mg of vitamin D per day.  If you are older than age 55 but younger than age 65, get at least 1,200 mg of calcium and at least 600 mg of vitamin D per day.  If you are older than age 54, get at least 1,200 mg of calcium and at least 800 mg of vitamin D per day.  Smoking and excessive alcohol intake increase the risk of osteoporosis. Eat foods that are rich in calcium and vitamin D, and do weight-bearing exercises several times each week as directed by your health care provider. What should I know about how menopause affects my mental health? Depression may occur at any age, but it is more common as you become older. Common symptoms of depression  include:  Low or sad mood.  Changes in sleep patterns.  Changes in appetite or eating patterns.  Feeling an overall lack of motivation or enjoyment of activities that you previously enjoyed.  Frequent crying spells.  Talk with your health care provider if you think that you are experiencing depression. What should I know about immunizations? It is important that you get and maintain your immunizations. These include:  Tetanus, diphtheria, and pertussis (Tdap) booster vaccine.  Influenza every year before the flu season begins.  Pneumonia vaccine.  Shingles vaccine.  Your health care provider may also recommend other immunizations. This information is not intended to replace advice given to you by your health care provider. Make sure you discuss any questions you have with your health care provider. Document Released: 01/25/2006  Document Revised: 06/22/2016 Document Reviewed: 09/06/2015 Elsevier Interactive Patient Education  2018 Elsevier Inc.  

## 2017-09-09 NOTE — Addendum Note (Signed)
Addended by: Burnett Kanaris on: 09/09/2017 09:18 AM   Modules accepted: Orders

## 2017-09-09 NOTE — Progress Notes (Signed)
Christina Sweeney Apr 06, 1966 [id number]    History:    Presents for annual exam.  Last cycle 05/2017, several missed cycles in the past year prior to that monthly. 2000 myomectomy, 2013 uterine embolization. Not sexually active in years. Normal Pap and mammogram history. Sister diagnosed with breast cancer 2017, negative BRCA. 11/2016 negative colonoscopy. Hypertension primary care manages. HSV rare outbreaks.  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  Mother diabetes, hypertension, died of ovarian cancer at age 52. Continues to have a relationship with past partner's 2 daughters that are now 110 and 79.  ROS:  A ROS was performed and pertinent positives and negatives are included.  Exam:  Vitals:   09/09/17 0817  BP: 124/80  Weight: 177 lb (80.3 kg)  Height: _0  (1.6 m)   Body mass index is 31.35 kg/m.   General appearance:  Normal Thyroid:  Symmetrical, normal in size, without palpable masses or nodularity. Respiratory  Auscultation:  Clear without wheezing or rhonchi Cardiovascular  Auscultation:  Regular rate, without rubs, murmurs or gallops  Edema/varicosities:  Not grossly evident Abdominal  Soft,nontender, without masses, guarding or rebound.  Liver/spleen:  No organomegaly noted  Hernia:  None appreciated  Skin  Inspection:  Grossly normal   Breasts: Examined lying and sitting.     Right: Without masses, retractions, discharge or axillary adenopathy.     Left: Without masses, retractions, discharge or axillary adenopathy. Gentitourinary   Inguinal/mons:  Normal without inguinal adenopathy  External genitalia:  Normal  BUS/Urethra/Skene's glands:  Normal  Vagina:  Normal  Cervix:  Normal  Uterus:  8 weeks' size, shape and contour.  Midline and mobile  Adnexa/parametria:     Rt: Without masses or tenderness.   Lt: Without masses or tenderness.  Anus and  perineum: Normal  Digital rectal exam: Normal sphincter tone without palpated masses or tenderness  Assessment/Plan:  51 y.o. SBF G0 for annual exam with no complaints.  Perimenopausal Fibroid uterus-asymptomatic Hypertension-primary care manages labs and meds HSV 2 rare outbreaks  Plan: Menopause reviewed, reviewed may have no further cycles. Valtrex 500 twice daily for 3-5 days as needed, prescription given. SBE's, continue annual 3-D screening mammogram, calcium rich diet, vitamin D 2000 daily encouraged. Reviewed importance of continuing regular exercise, weightbearing exercise. Denies need for contraception. Pap with HR HPV typing, new screening guidelines reviewed.    [place], 8:58 AM 09/09/2017

## 2017-09-10 LAB — NO CULTURE INDICATED

## 2017-09-10 LAB — PAP, TP IMAGING W/ HPV RNA, RFLX HPV TYPE 16,18/45: HPV DNA High Risk: NOT DETECTED

## 2017-09-10 LAB — URINALYSIS W MICROSCOPIC + REFLEX CULTURE
Bacteria, UA: NONE SEEN /HPF
Bilirubin Urine: NEGATIVE
GLUCOSE, UA: NEGATIVE
HYALINE CAST: NONE SEEN /LPF
Hgb urine dipstick: NEGATIVE
Ketones, ur: NEGATIVE
Leukocyte Esterase: NEGATIVE
Nitrites, Initial: NEGATIVE
PROTEIN: NEGATIVE
RBC / HPF: NONE SEEN /HPF (ref 0–2)
Specific Gravity, Urine: 1.014 (ref 1.001–1.03)
Squamous Epithelial / LPF: NONE SEEN /HPF (ref <=5)
WBC, UA: NONE SEEN /HPF (ref 0–5)
pH: 6 (ref 5.0–8.0)

## 2017-11-22 DIAGNOSIS — Z6831 Body mass index (BMI) 31.0-31.9, adult: Secondary | ICD-10-CM | POA: Diagnosis not present

## 2017-11-22 DIAGNOSIS — R7301 Impaired fasting glucose: Secondary | ICD-10-CM | POA: Diagnosis not present

## 2017-11-22 DIAGNOSIS — I10 Essential (primary) hypertension: Secondary | ICD-10-CM | POA: Diagnosis not present

## 2017-11-22 DIAGNOSIS — R7303 Prediabetes: Secondary | ICD-10-CM | POA: Diagnosis not present

## 2017-12-20 DIAGNOSIS — N951 Menopausal and female climacteric states: Secondary | ICD-10-CM | POA: Diagnosis not present

## 2017-12-20 DIAGNOSIS — R635 Abnormal weight gain: Secondary | ICD-10-CM | POA: Diagnosis not present

## 2017-12-27 DIAGNOSIS — E669 Obesity, unspecified: Secondary | ICD-10-CM | POA: Diagnosis not present

## 2017-12-27 DIAGNOSIS — R7303 Prediabetes: Secondary | ICD-10-CM | POA: Diagnosis not present

## 2017-12-27 DIAGNOSIS — E559 Vitamin D deficiency, unspecified: Secondary | ICD-10-CM | POA: Diagnosis not present

## 2017-12-27 DIAGNOSIS — K080 Exfoliation of teeth due to systemic causes: Secondary | ICD-10-CM | POA: Diagnosis not present

## 2017-12-27 DIAGNOSIS — I10 Essential (primary) hypertension: Secondary | ICD-10-CM | POA: Diagnosis not present

## 2018-01-06 DIAGNOSIS — R7303 Prediabetes: Secondary | ICD-10-CM | POA: Diagnosis not present

## 2018-01-06 DIAGNOSIS — E039 Hypothyroidism, unspecified: Secondary | ICD-10-CM | POA: Diagnosis not present

## 2018-01-06 DIAGNOSIS — E669 Obesity, unspecified: Secondary | ICD-10-CM | POA: Diagnosis not present

## 2018-01-13 DIAGNOSIS — R7303 Prediabetes: Secondary | ICD-10-CM | POA: Diagnosis not present

## 2018-01-13 DIAGNOSIS — E669 Obesity, unspecified: Secondary | ICD-10-CM | POA: Diagnosis not present

## 2018-01-13 DIAGNOSIS — I10 Essential (primary) hypertension: Secondary | ICD-10-CM | POA: Diagnosis not present

## 2018-02-26 ENCOUNTER — Other Ambulatory Visit: Payer: Self-pay | Admitting: Women's Health

## 2018-02-26 ENCOUNTER — Ambulatory Visit
Admission: RE | Admit: 2018-02-26 | Discharge: 2018-02-26 | Disposition: A | Discharge status: Home / Self Care | Payer: Federal, State, Local not specified - PPO | Source: Ambulatory Visit | Attending: Women's Health | Admitting: Women's Health

## 2018-02-26 DIAGNOSIS — Z7689 Persons encountering health services in other specified circumstances: Secondary | ICD-10-CM | POA: Diagnosis not present

## 2018-02-26 DIAGNOSIS — Z713 Dietary counseling and surveillance: Secondary | ICD-10-CM | POA: Diagnosis not present

## 2018-02-26 DIAGNOSIS — Z1231 Encounter for screening mammogram for malignant neoplasm of breast: Secondary | ICD-10-CM

## 2018-02-26 DIAGNOSIS — Z6831 Body mass index (BMI) 31.0-31.9, adult: Secondary | ICD-10-CM | POA: Diagnosis not present

## 2018-02-28 ENCOUNTER — Encounter: Payer: Self-pay | Admitting: Women's Health

## 2018-04-28 DIAGNOSIS — Z713 Dietary counseling and surveillance: Secondary | ICD-10-CM | POA: Diagnosis not present

## 2018-04-28 DIAGNOSIS — I10 Essential (primary) hypertension: Secondary | ICD-10-CM | POA: Diagnosis not present

## 2018-04-28 DIAGNOSIS — Z6831 Body mass index (BMI) 31.0-31.9, adult: Secondary | ICD-10-CM | POA: Diagnosis not present

## 2018-04-28 DIAGNOSIS — Z7689 Persons encountering health services in other specified circumstances: Secondary | ICD-10-CM | POA: Diagnosis not present

## 2018-06-27 DIAGNOSIS — K080 Exfoliation of teeth due to systemic causes: Secondary | ICD-10-CM | POA: Diagnosis not present

## 2018-06-30 DIAGNOSIS — Z713 Dietary counseling and surveillance: Secondary | ICD-10-CM | POA: Diagnosis not present

## 2018-06-30 DIAGNOSIS — E669 Obesity, unspecified: Secondary | ICD-10-CM | POA: Diagnosis not present

## 2018-09-10 ENCOUNTER — Ambulatory Visit: Payer: Federal, State, Local not specified - PPO | Admitting: Women's Health

## 2018-09-10 ENCOUNTER — Encounter: Payer: Self-pay | Admitting: Women's Health

## 2018-09-10 VITALS — BP 118/78 | Ht 63.0 in | Wt 174.0 lb

## 2018-09-10 DIAGNOSIS — Z01419 Encounter for gynecological examination (general) (routine) without abnormal findings: Secondary | ICD-10-CM | POA: Diagnosis not present

## 2018-09-10 DIAGNOSIS — B009 Herpesviral infection, unspecified: Secondary | ICD-10-CM

## 2018-09-10 MED ORDER — VALACYCLOVIR HCL 500 MG PO TABS: ORAL_TABLET | ORAL | 11 refills | Status: DC

## 2018-09-10 NOTE — Progress Notes (Signed)
Christina Sweeney 1966/06/26 [id number]    History:    Presents for annual exam.  Regular monthly light 3 to 4 days cycle.  2000 myomectomy, 2013 uterine embolization.  Not sexually active.  Denies need for STD screen.  Normal Pap and mammogram history.  Sister breast cancer doing well BRCA negative.  2017- colonoscopy.  Uterus has several small fibroids asymptomatic.  HSV no outbreaks.  Hypertension primary care manages labs and meds  Past medical history, past surgical history, family history and social history were all reviewed and documented in the EPIC chart.  Works for the [employer].  Mother diabetes, hypertension died from ovarian cancer at age 47.  ROS:  A ROS was performed and pertinent positives and negatives are included.  Exam:  Vitals:   09/10/18 0841  BP: 118/78  Weight: 174 lb (78.9 kg)  Height: _0  (1.6 m)   Body mass index is 30.82 kg/m.   General appearance:  Normal Thyroid:  Symmetrical, normal in size, without palpable masses or nodularity. Respiratory  Auscultation:  Clear without wheezing or rhonchi Cardiovascular  Auscultation:  Regular rate, without rubs, murmurs or gallops  Edema/varicosities:  Not grossly evident Abdominal  Soft,nontender, without masses, guarding or rebound.  Liver/spleen:  No organomegaly noted  Hernia:  None appreciated  Skin  Inspection:  Grossly normal   Breasts: Examined lying and sitting.     Right: Without masses, retractions, discharge or axillary adenopathy.     Left: Without masses, retractions, discharge or axillary adenopathy. Gentitourinary   Inguinal/mons:  Normal without inguinal adenopathy  External genitalia:  Normal  BUS/Urethra/Skene's glands:  Normal  Vagina:  Normal  Cervix:  Normal  Uterus:   normal in size, shape and contour.  Midline and mobile  Adnexa/parametria:     Rt: Without masses or tenderness.   Lt: Without masses or tenderness.  Anus and perineum: Normal  Digital  rectal exam: Normal sphincter tone without palpated masses or tenderness  Assessment/Plan:  52 y.o. SBF G0 for annual exam with no complaints.  Monthly cycle/not sexually active 2000 myomectomy, 2013 uterine embolization Small fibroids Hypertension-primary care manages labs and meds HSV no outbreaks  Plan: SBE's, continue annual 3D screening mammogram, calcium rich foods, vitamin D 2000 daily encouraged.  Reviewed importance of continuing active lifestyle of regular exercise, balance type exercise reviewed and encouraged also.  Valtrex 500 twice daily for 3 to 5 days as needed prescription, proper use given and reviewed.  Pap normal 2018, new screening guidelines reviewed.  Huel Cote [facility], 10:40 AM 09/10/2018

## 2018-09-10 NOTE — Patient Instructions (Addendum)
Health Maintenance for Postmenopausal Women Menopause is a normal process in which your reproductive ability comes to an end. This process happens gradually over a span of months to years, usually between the ages of 22 and 9. Menopause is complete when you have missed 12 consecutive menstrual periods. It is important to talk with your health care provider about some of the most common conditions that affect postmenopausal women, such as heart disease, cancer, and bone loss (osteoporosis). Adopting a healthy lifestyle and getting preventive care can help to promote your health and wellness. Those actions can also lower your chances of developing some of these common conditions. What should I know about menopause? During menopause, you may experience a number of symptoms, such as:  Moderate-to-severe hot flashes.  Night sweats.  Decrease in sex drive.  Mood swings.  Headaches.  Tiredness.  Irritability.  Memory problems.  Insomnia.  Choosing to treat or not to treat menopausal changes is an individual decision that you make with your health care provider. What should I know about hormone replacement therapy and supplements? Hormone therapy products are effective for treating symptoms that are associated with menopause, such as hot flashes and night sweats. Hormone replacement carries certain risks, especially as you become older. If you are thinking about using estrogen or estrogen with progestin treatments, discuss the benefits and risks with your health care provider. What should I know about heart disease and stroke? Heart disease, heart attack, and stroke become more likely as you age. This may be due, in part, to the hormonal changes that your body experiences during menopause. These can affect how your body processes dietary fats, triglycerides, and cholesterol. Heart attack and stroke are both medical emergencies. There are many things that you can do to help prevent heart disease  and stroke:  Have your blood pressure checked at least every 1-2 years. High blood pressure causes heart disease and increases the risk of stroke.  If you are 53-22 years old, ask your health care provider if you should take aspirin to prevent a heart attack or a stroke.  Do not use any tobacco products, including cigarettes, chewing tobacco, or electronic cigarettes. If you need help quitting, ask your health care provider.  It is important to eat a healthy diet and maintain a healthy weight. ? Be sure to include plenty of vegetables, fruits, low-fat dairy products, and lean protein. ? Avoid eating foods that are high in solid fats, added sugars, or salt (sodium).  Get regular exercise. This is one of the most important things that you can do for your health. ? Try to exercise for at least 150 minutes each week. The type of exercise that you do should increase your heart rate and make you sweat. This is known as moderate-intensity exercise. ? Try to do strengthening exercises at least twice each week. Do these in addition to the moderate-intensity exercise.  Know your numbers.Ask your health care provider to check your cholesterol and your blood glucose. Continue to have your blood tested as directed by your health care provider.  What should I know about cancer screening? There are several types of cancer. Take the following steps to reduce your risk and to catch any cancer development as early as possible. Breast Cancer  Practice breast self-awareness. ? This means understanding how your breasts normally appear and feel. ? It also means doing regular breast self-exams. Let your health care provider know about any changes, no matter how small.  If you are 40  or older, have a clinician do a breast exam (clinical breast exam or CBE) every year. Depending on your age, family history, and medical history, it may be recommended that you also have a yearly breast X-ray (mammogram).  If you  have a family history of breast cancer, talk with your health care provider about genetic screening.  If you are at high risk for breast cancer, talk with your health care provider about having an MRI and a mammogram every year.  Breast cancer (BRCA) gene test is recommended for women who have family members with BRCA-related cancers. Results of the assessment will determine the need for genetic counseling and BRCA1 and for BRCA2 testing. BRCA-related cancers include these types: ? Breast. This occurs in males or females. ? Ovarian. ? Tubal. This may also be called fallopian tube cancer. ? Cancer of the abdominal or pelvic lining (peritoneal cancer). ? Prostate. ? Pancreatic.  Cervical, Uterine, and Ovarian Cancer Your health care provider may recommend that you be screened regularly for cancer of the pelvic organs. These include your ovaries, uterus, and vagina. This screening involves a pelvic exam, which includes checking for microscopic changes to the surface of your cervix (Pap test).  For women ages 21-65, health care providers may recommend a pelvic exam and a Pap test every three years. For women ages 79-65, they may recommend the Pap test and pelvic exam, combined with testing for human papilloma virus (HPV), every five years. Some types of HPV increase your risk of cervical cancer. Testing for HPV may also be done on women of any age who have unclear Pap test results.  Other health care providers may not recommend any screening for nonpregnant women who are considered low risk for pelvic cancer and have no symptoms. Ask your health care provider if a screening pelvic exam is right for you.  If you have had past treatment for cervical cancer or a condition that could lead to cancer, you need Pap tests and screening for cancer for at least 20 years after your treatment. If Pap tests have been discontinued for you, your risk factors (such as having a new sexual partner) need to be  reassessed to determine if you should start having screenings again. Some women have medical problems that increase the chance of getting cervical cancer. In these cases, your health care provider may recommend that you have screening and Pap tests more often.  If you have a family history of uterine cancer or ovarian cancer, talk with your health care provider about genetic screening.  If you have vaginal bleeding after reaching menopause, tell your health care provider.  There are currently no reliable tests available to screen for ovarian cancer.  Lung Cancer Lung cancer screening is recommended for adults 69-62 years old who are at high risk for lung cancer because of a history of smoking. A yearly low-dose CT scan of the lungs is recommended if you:  Currently smoke.  Have a history of at least 30 pack-years of smoking and you currently smoke or have quit within the past 15 years. A pack-year is smoking an average of one pack of cigarettes per day for one year.  Yearly screening should:  Continue until it has been 15 years since you quit.  Stop if you develop a health problem that would prevent you from having lung cancer treatment.  Colorectal Cancer  This type of cancer can be detected and can often be prevented.  Routine colorectal cancer screening usually begins at  age 42 and continues through age 45.  If you have risk factors for colon cancer, your health care provider may recommend that you be screened at an earlier age.  If you have a family history of colorectal cancer, talk with your health care provider about genetic screening.  Your health care provider may also recommend using home test kits to check for hidden blood in your stool.  A small camera at the end of a tube can be used to examine your colon directly (sigmoidoscopy or colonoscopy). This is done to check for the earliest forms of colorectal cancer.  Direct examination of the colon should be repeated every  5-10 years until age 71. However, if early forms of precancerous polyps or small growths are found or if you have a family history or genetic risk for colorectal cancer, you may need to be screened more often.  Skin Cancer  Check your skin from head to toe regularly.  Monitor any moles. Be sure to tell your health care provider: ? About any new moles or changes in moles, especially if there is a change in a mole's shape or color. ? If you have a mole that is larger than the size of a pencil eraser.  If any of your family members has a history of skin cancer, especially at a Jezlyn Westerfield age, talk with your health care provider about genetic screening.  Always use sunscreen. Apply sunscreen liberally and repeatedly throughout the day.  Whenever you are outside, protect yourself by wearing long sleeves, pants, a wide-brimmed hat, and sunglasses.  What should I know about osteoporosis? Osteoporosis is a condition in which bone destruction happens more quickly than new bone creation. After menopause, you may be at an increased risk for osteoporosis. To help prevent osteoporosis or the bone fractures that can happen because of osteoporosis, the following is recommended:  If you are 46-71 years old, get at least 1,000 mg of calcium and at least 600 mg of vitamin D per day.  If you are older than age 55 but younger than age 65, get at least 1,200 mg of calcium and at least 600 mg of vitamin D per day.  If you are older than age 54, get at least 1,200 mg of calcium and at least 800 mg of vitamin D per day.  Smoking and excessive alcohol intake increase the risk of osteoporosis. Eat foods that are rich in calcium and vitamin D, and do weight-bearing exercises several times each week as directed by your health care provider. What should I know about how menopause affects my mental health? Depression may occur at any age, but it is more common as you become older. Common symptoms of depression  include:  Low or sad mood.  Changes in sleep patterns.  Changes in appetite or eating patterns.  Feeling an overall lack of motivation or enjoyment of activities that you previously enjoyed.  Frequent crying spells.  Talk with your health care provider if you think that you are experiencing depression. What should I know about immunizations? It is important that you get and maintain your immunizations. These include:  Tetanus, diphtheria, and pertussis (Tdap) booster vaccine.  Influenza every year before the flu season begins.  Pneumonia vaccine.  Shingles vaccine.  Your health care provider may also recommend other immunizations. This information is not intended to replace advice given to you by your health care provider. Make sure you discuss any questions you have with your health care provider. Document Released: 01/25/2006  Document Revised: 06/22/2016 Document Reviewed: 09/06/2015 Elsevier Interactive Patient Education  2018 [place] implant What is this medicine? ETONOGESTREL (et oh noe JES trel) is a contraceptive (birth control) device. It is used to prevent pregnancy. It can be used for up to 3 years. This medicine may be used for other purposes; ask your health care provider or pharmacist if you have questions. COMMON BRAND NAME(S): Implanon, Nexplanon What should I tell my health care provider before I take this medicine? They need to know if you have any of these conditions: -abnormal vaginal bleeding -blood vessel disease or blood clots -cancer of the breast, cervix, or liver -depression -diabetes -gallbladder disease -headaches -heart disease or recent heart attack -high blood pressure -high cholesterol -kidney disease -liver disease -renal disease -seizures -tobacco smoker -an unusual or allergic reaction to etonogestrel, other hormones, anesthetics or antiseptics, medicines, foods, dyes, or preservatives -pregnant or trying to get  pregnant -breast-feeding How should I use this medicine? This device is inserted just under the skin on the inner side of your upper arm by a health care professional. Talk to your pediatrician regarding the use of this medicine in children. Special care may be needed. Overdosage: If you think you have taken too much of this medicine contact a poison control center or emergency room at once. NOTE: This medicine is only for you. Do not share this medicine with others. What if I miss a dose? This does not apply. What may interact with this medicine? Do not take this medicine with any of the following medications: -amprenavir -bosentan -fosamprenavir This medicine may also interact with the following medications: -barbiturate medicines for inducing sleep or treating seizures -certain medicines for fungal infections like ketoconazole and itraconazole -grapefruit juice -griseofulvin -medicines to treat seizures like carbamazepine, felbamate, oxcarbazepine, phenytoin, topiramate -modafinil -phenylbutazone -rifampin -rufinamide -some medicines to treat HIV infection like atazanavir, indinavir, lopinavir, nelfinavir, tipranavir, ritonavir -St. John's wort This list may not describe all possible interactions. Give your health care provider a list of all the medicines, herbs, non-prescription drugs, or dietary supplements you use. Also tell them if you smoke, drink alcohol, or use illegal drugs. Some items may interact with your medicine. What should I watch for while using this medicine? This product does not protect you against HIV infection (AIDS) or other sexually transmitted diseases. You should be able to feel the implant by pressing your fingertips over the skin where it was inserted. Contact your doctor if you cannot feel the implant, and use a non-hormonal birth control method (such as condoms) until your doctor confirms that the implant is in place. If you feel that the implant may have  broken or become bent while in your arm, contact your healthcare provider. What side effects may I notice from receiving this medicine? Side effects that you should report to your doctor or health care professional as soon as possible: -allergic reactions like skin rash, itching or hives, swelling of the face, lips, or tongue -breast lumps -changes in emotions or moods -depressed mood -heavy or prolonged menstrual bleeding -pain, irritation, swelling, or bruising at the insertion site -scar at site of insertion -signs of infection at the insertion site such as fever, and skin redness, pain or discharge -signs of pregnancy -signs and symptoms of a blood clot such as breathing problems; changes in vision; chest pain; severe, sudden headache; pain, swelling, warmth in the leg; trouble speaking; sudden numbness or weakness of the face, arm or leg -signs and symptoms of liver  injury like dark yellow or brown urine; general ill feeling or flu-like symptoms; light-colored stools; loss of appetite; nausea; right upper belly pain; unusually weak or tired; yellowing of the eyes or skin -unusual vaginal bleeding, discharge -signs and symptoms of a stroke like changes in vision; confusion; trouble speaking or understanding; severe headaches; sudden numbness or weakness of the face, arm or leg; trouble walking; dizziness; loss of balance or coordination Side effects that usually do not require medical attention (report to your doctor or health care professional if they continue or are bothersome): -acne -back pain -breast pain -changes in weight -dizziness -general ill feeling or flu-like symptoms -headache -irregular menstrual bleeding -nausea -sore throat -vaginal irritation or inflammation This list may not describe all possible side effects. Call your doctor for medical advice about side effects. You may report side effects to FDA at 1-800-FDA-1088. Where should I keep my medicine? This drug is  given in a hospital or clinic and will not be stored at home. NOTE: This sheet is a summary. It may not cover all possible information. If you have questions about this medicine, talk to your doctor, pharmacist, or health care provider.  2018 Elsevier/Gold Standard (2016-06-21 11:19:22)

## 2018-11-21 DIAGNOSIS — R7301 Impaired fasting glucose: Secondary | ICD-10-CM | POA: Diagnosis not present

## 2018-11-21 DIAGNOSIS — Z13220 Encounter for screening for lipoid disorders: Secondary | ICD-10-CM | POA: Diagnosis not present

## 2018-11-21 DIAGNOSIS — I10 Essential (primary) hypertension: Secondary | ICD-10-CM | POA: Diagnosis not present

## 2019-01-02 DIAGNOSIS — K080 Exfoliation of teeth due to systemic causes: Secondary | ICD-10-CM | POA: Diagnosis not present

## 2019-03-18 ENCOUNTER — Other Ambulatory Visit: Payer: Self-pay | Admitting: Women's Health

## 2019-03-18 DIAGNOSIS — Z1231 Encounter for screening mammogram for malignant neoplasm of breast: Secondary | ICD-10-CM

## 2019-05-15 ENCOUNTER — Ambulatory Visit: Payer: Federal, State, Local not specified - PPO

## 2019-05-30 ENCOUNTER — Ambulatory Visit
Admission: RE | Admit: 2019-05-30 | Discharge: 2019-05-30 | Disposition: A | Discharge status: Home / Self Care | Payer: Federal, State, Local not specified - PPO | Source: Ambulatory Visit | Attending: Women's Health | Admitting: Women's Health

## 2019-05-30 ENCOUNTER — Other Ambulatory Visit: Payer: Self-pay

## 2019-05-30 DIAGNOSIS — Z1231 Encounter for screening mammogram for malignant neoplasm of breast: Secondary | ICD-10-CM

## 2019-07-10 DIAGNOSIS — K080 Exfoliation of teeth due to systemic causes: Secondary | ICD-10-CM | POA: Diagnosis not present

## 2019-09-11 ENCOUNTER — Other Ambulatory Visit: Payer: Self-pay

## 2019-09-14 ENCOUNTER — Encounter: Payer: Self-pay | Admitting: Women's Health

## 2019-09-14 ENCOUNTER — Ambulatory Visit (INDEPENDENT_AMBULATORY_CARE_PROVIDER_SITE_OTHER): Payer: Federal, State, Local not specified - PPO | Admitting: Women's Health

## 2019-09-14 ENCOUNTER — Other Ambulatory Visit: Payer: Self-pay

## 2019-09-14 VITALS — BP 110/80 | Ht 63.0 in | Wt 182.0 lb

## 2019-09-14 DIAGNOSIS — B009 Herpesviral infection, unspecified: Secondary | ICD-10-CM | POA: Diagnosis not present

## 2019-09-14 DIAGNOSIS — Z01419 Encounter for gynecological examination (general) (routine) without abnormal findings: Secondary | ICD-10-CM | POA: Diagnosis not present

## 2019-09-14 MED ORDER — VALACYCLOVIR HCL 500 MG PO TABS: ORAL_TABLET | ORAL | 11 refills | Status: DC

## 2019-09-14 NOTE — Patient Instructions (Signed)

## 2019-09-14 NOTE — Progress Notes (Signed)
Christina Sweeney 10/05/1966 [id number]    History:    Presents for annual exam.  Cycles rare, last cycle > 6 mo ago, rare menopausal symptoms.  Normal Pap and mammogram history.  2000 fibroidectomy, 2013 fibroid embolization.  2017- colonoscopy.  Primary care manages hypertension.  Sister breast cancer survivor, BRCA negative.  HSV rare outbreaks.  Not sexually active.  2007 right breast lumpectomy benign .  Past medical history, past surgical history, family history and social history were all reviewed and documented in the EPIC chart.  [employer].  Mother diabetes, hypertension deceased from ovarian cancer at age 75.  ROS:  A ROS was performed and pertinent positives and negatives are included.  Exam:  Vitals:   09/14/19 0828  BP: 110/80  Weight: 182 lb (82.6 kg)  Height: 5' 3" (1.6 m)   Body mass index is 32.24 kg/m.   General appearance:  Normal Thyroid:  Symmetrical, normal in size, without palpable masses or nodularity. Respiratory  Auscultation:  Clear without wheezing or rhonchi Cardiovascular  Auscultation:  Regular rate, without rubs, murmurs or gallops  Edema/varicosities:  Not grossly evident Abdominal  Soft,nontender, without masses, guarding or rebound.  Liver/spleen:  No organomegaly noted  Hernia:  None appreciated  Skin  Inspection:  Grossly normal   Breasts: Examined lying and sitting.     Right: Without masses, retractions, discharge or axillary adenopathy.     Left: Without masses, retractions, discharge or axillary adenopathy. Gentitourinary   Inguinal/mons:  Normal without inguinal adenopathy  External genitalia:  Normal  BUS/Urethra/Skene's glands:  Normal  Vagina:  Normal  Cervix:  Normal  Uterus:  10 week size/fibroids, shape and contour.  Midline and mobile  Adnexa/parametria:     Rt: Without masses or tenderness.   Lt: Without masses or tenderness.  Anus and perineum: Normal  Digital rectal exam: Normal sphincter tone without palpated  masses or tenderness  Assessment/Plan:  53 y.o. SBF G0 for annual exam with no complaints.  Perimenopausal rare cycles/asymptomatic fibroids HSV rare outbreaks Hypertension-primary care manages labs and meds Obesity  Plan: Valtrex 500 twice daily for 3 to 5 days as needed prescription, proper use given and reviewed.  Menopause reviewed, instructed to call if problematic symptoms or irregular bleeding.  SBEs, continue annual screening mammogram, calcium rich foods, vitamin D 2000 daily encouraged.  Continue active lifestyle of regular exercise, decrease calorie/carbs encouraged.  Pap normal 2018, new screening guidelines reviewed.  Nancy J Young WHNP, 9:04 AM 09/14/2019  

## 2019-11-23 DIAGNOSIS — Z13220 Encounter for screening for lipoid disorders: Secondary | ICD-10-CM | POA: Diagnosis not present

## 2019-11-23 DIAGNOSIS — R7303 Prediabetes: Secondary | ICD-10-CM | POA: Diagnosis not present

## 2019-11-23 DIAGNOSIS — I10 Essential (primary) hypertension: Secondary | ICD-10-CM | POA: Diagnosis not present

## 2019-12-08 DIAGNOSIS — Z03818 Encounter for observation for suspected exposure to other biological agents ruled out: Secondary | ICD-10-CM | POA: Diagnosis not present

## 2019-12-08 DIAGNOSIS — Z20828 Contact with and (suspected) exposure to other viral communicable diseases: Secondary | ICD-10-CM | POA: Diagnosis not present

## 2020-05-20 ENCOUNTER — Other Ambulatory Visit: Payer: Self-pay | Admitting: Women's Health

## 2020-05-20 DIAGNOSIS — Z1231 Encounter for screening mammogram for malignant neoplasm of breast: Secondary | ICD-10-CM

## 2020-05-31 ENCOUNTER — Ambulatory Visit
Admission: RE | Admit: 2020-05-31 | Discharge: 2020-05-31 | Disposition: A | Discharge status: Home / Self Care | Payer: Federal, State, Local not specified - PPO | Source: Ambulatory Visit | Attending: Obstetrics and Gynecology | Admitting: Obstetrics and Gynecology

## 2020-05-31 ENCOUNTER — Other Ambulatory Visit: Payer: Self-pay

## 2020-05-31 DIAGNOSIS — Z1231 Encounter for screening mammogram for malignant neoplasm of breast: Secondary | ICD-10-CM

## 2020-07-10 IMAGING — MG DIGITAL SCREENING BILATERAL MAMMOGRAM WITH TOMO AND CAD
8 series · 8 of 24 positions shown · non-contrast
Comparison: Previous exam(s).

CLINICAL DATA: Screening.

EXAM:
DIGITAL SCREENING BILATERAL MAMMOGRAM WITH TOMO AND CAD

[L CC synth-2D]
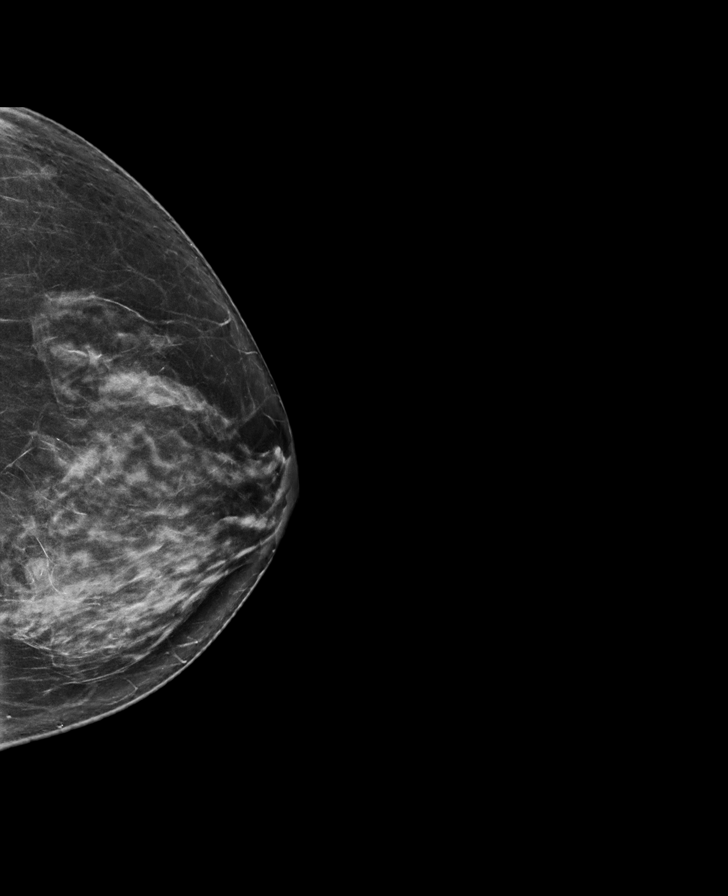

[L MLO synth-2D]
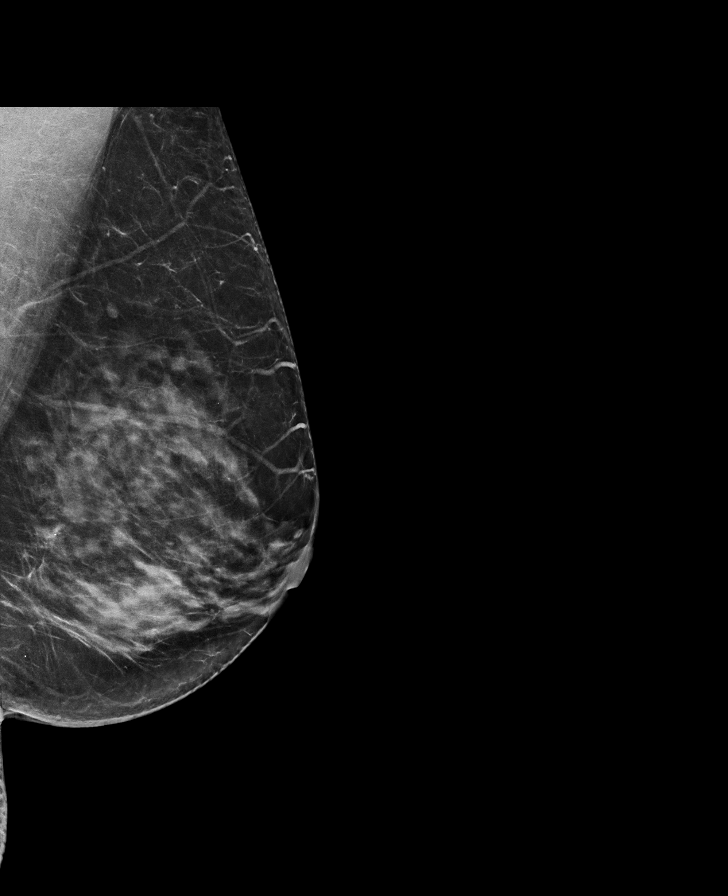

[R CC synth-2D]
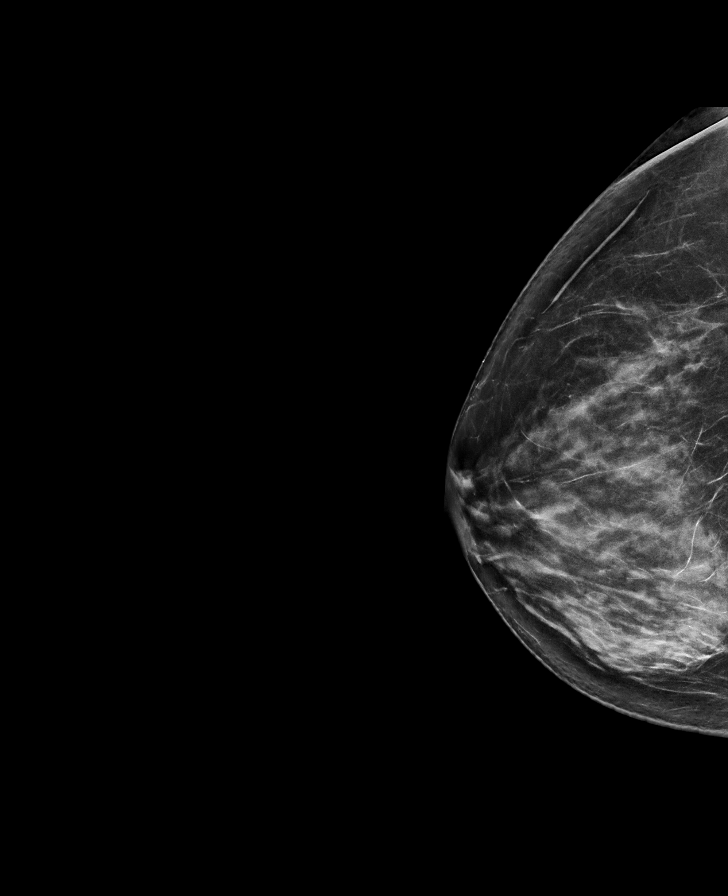

[R MLO synth-2D]
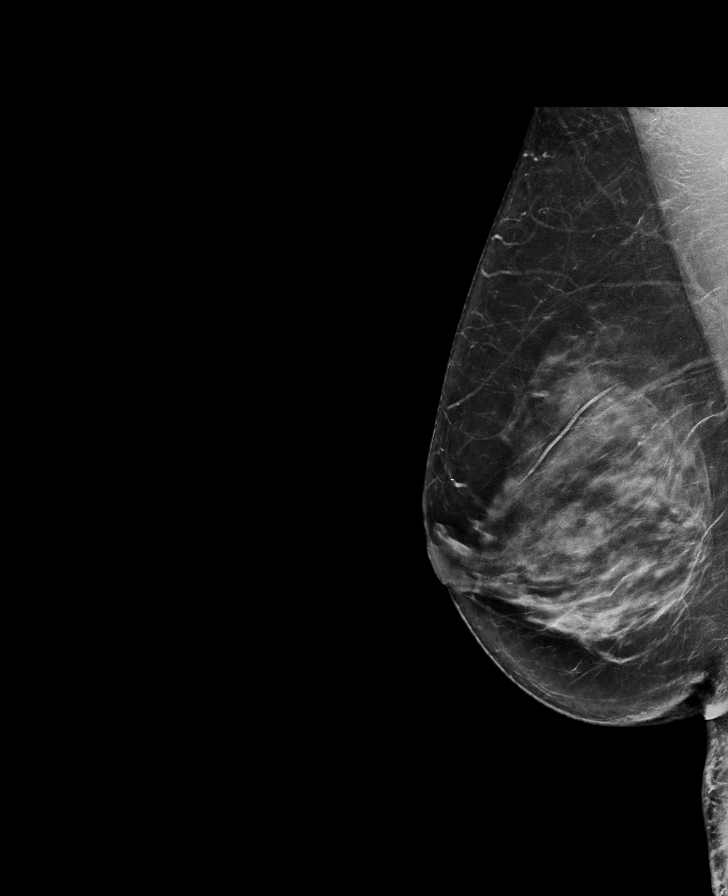

[R MLO tomo · tomo slice 42/83.0]
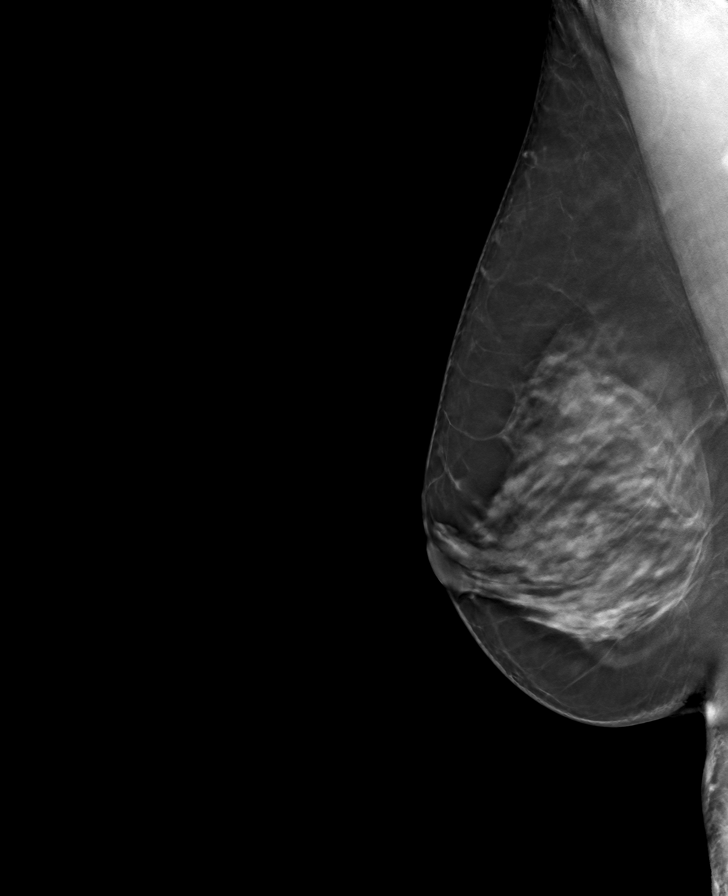

[L CC tomo · tomo slice 39/76.0]
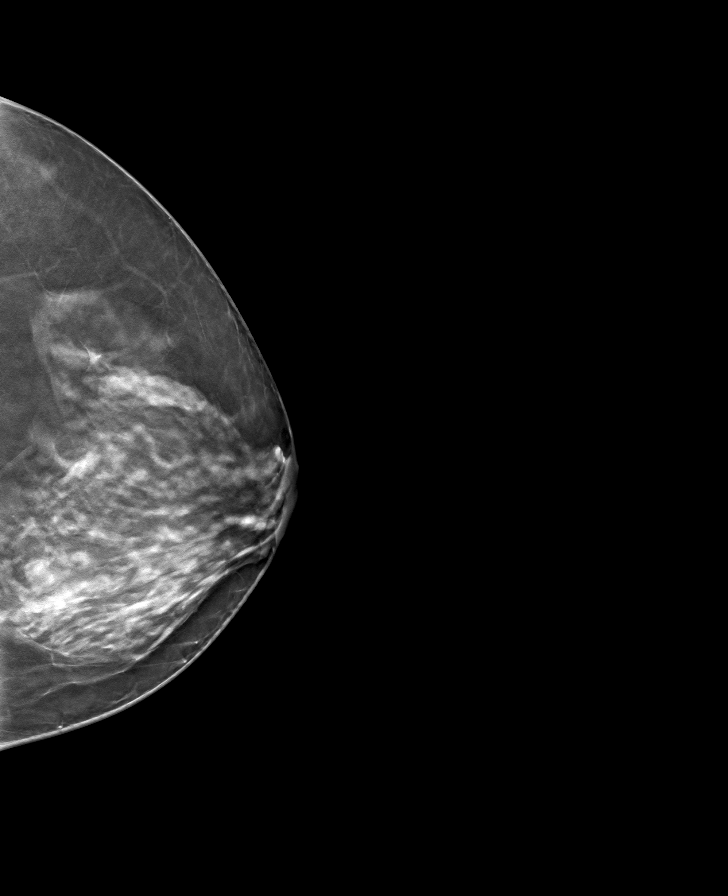

[R CC tomo · tomo slice 37/74.0]
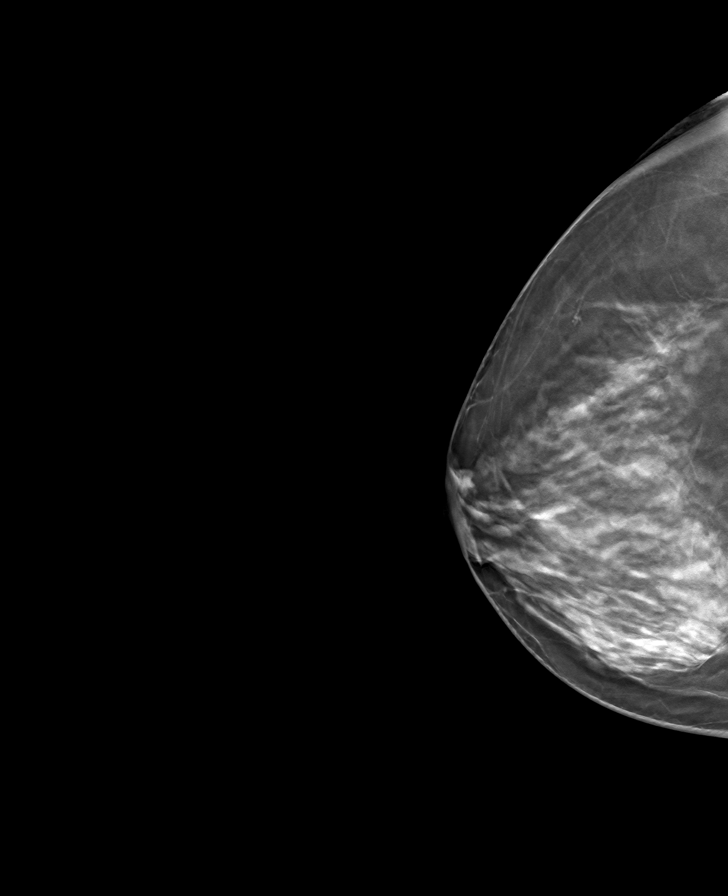

[L MLO tomo · tomo slice 39/76.0]
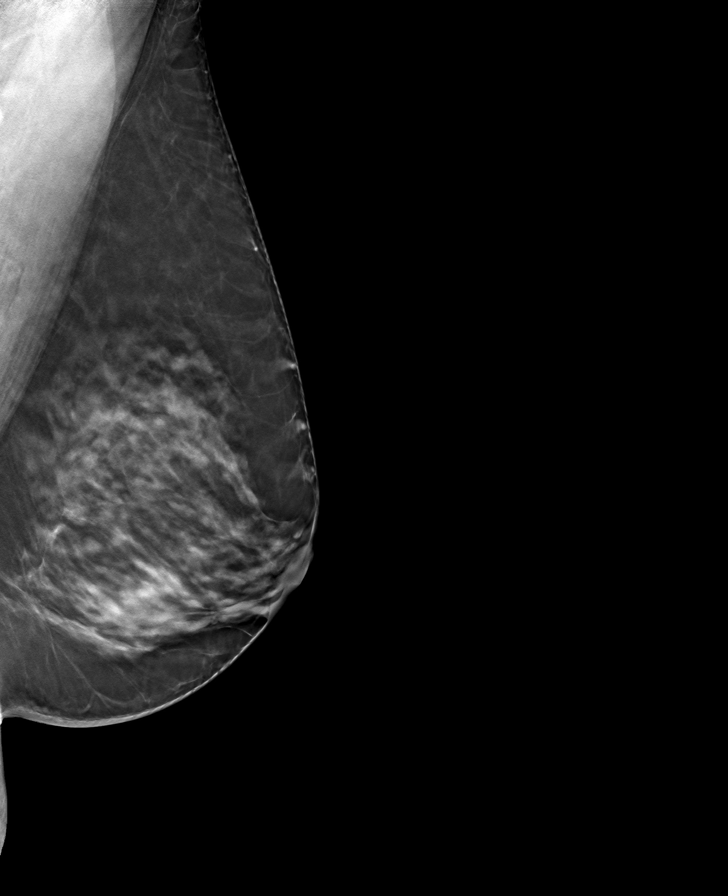

[8 of 24 positions shown; findings below may reference images not displayed]

ACR Breast Density Category c: The breast tissue is heterogeneously
dense, which may obscure small masses.
FINDINGS: There are no findings suspicious for malignancy. Images were
processed with CAD.
IMPRESSION: No mammographic evidence of malignancy. A result letter of this
screening mammogram will be mailed directly to the patient.

RECOMMENDATION:
Screening mammogram in one year. (Code:FT-U-LHB)

BI-RADS CATEGORY  1: Negative.

## 2020-09-14 ENCOUNTER — Ambulatory Visit (INDEPENDENT_AMBULATORY_CARE_PROVIDER_SITE_OTHER): Payer: Federal, State, Local not specified - PPO | Admitting: Nurse Practitioner

## 2020-09-14 ENCOUNTER — Encounter: Payer: Self-pay | Admitting: Nurse Practitioner

## 2020-09-14 ENCOUNTER — Other Ambulatory Visit: Payer: Self-pay

## 2020-09-14 VITALS — BP 124/80 | Ht 63.0 in | Wt 179.0 lb

## 2020-09-14 DIAGNOSIS — N951 Menopausal and female climacteric states: Secondary | ICD-10-CM | POA: Diagnosis not present

## 2020-09-14 DIAGNOSIS — Z01419 Encounter for gynecological examination (general) (routine) without abnormal findings: Secondary | ICD-10-CM

## 2020-09-14 DIAGNOSIS — D259 Leiomyoma of uterus, unspecified: Secondary | ICD-10-CM

## 2020-09-14 NOTE — Progress Notes (Signed)
   Christina Sweeney May 13, 1966 [id number]   History:  54 y.o. G0 presents for annual exam. Perimenopausal. Cycles range from 3-6 months, denies menopausal symptoms. 2000 fibroidectomy, 2013 fibroid embolization. 2007 benign right breast lumpectomy. Normal pap history. Sister is breast cancer survivor, BRCA negative. Mother deceased from ovarian cancer at age 38. HSV, rare outbreaks.   Gynecologic History Contraception: post menopausal status Last Pap: 09/09/2017. Results were: normal Last mammogram: 06/01/2020. Results were: normal Last colonoscopy: 11/27/2016. Results were: normal   Past medical history, past surgical history, family history and social history were all reviewed and documented in the EPIC chart.  ROS:  A ROS was performed and pertinent positives and negatives are included.  Exam:  Vitals:   09/14/20 0928  BP: 124/80  Weight: 179 lb (81.2 kg)  Height: _0  (1.6 m)   Body mass index is 31.71 kg/m.  General appearance:  Normal Thyroid:  Symmetrical, normal in size, without palpable masses or nodularity. Respiratory  Auscultation:  Clear without wheezing or rhonchi Cardiovascular  Auscultation:  Regular rate, without rubs, murmurs or gallops  Edema/varicosities:  Not grossly evident Abdominal  Soft,nontender, without masses, guarding or rebound.  Liver/spleen:  No organomegaly noted  Hernia:  None appreciated  Skin  Inspection:  Grossly normal   Breasts: Examined lying and sitting.   Right: Without masses, retractions, discharge or axillary adenopathy.   Left: Without masses, retractions, discharge or axillary adenopathy. Gentitourinary   Inguinal/mons:  Normal without inguinal adenopathy  External genitalia:  Normal  BUS/Urethra/Skene's glands:  Normal  Vagina:  Normal  Cervix:  Normal  Uterus:  Difficult to palpate due to body habitus but no gross masses or tenderness   Adnexa/parametria:     Rt: Without masses or tenderness.   Lt: Without masses or  tenderness.  Anus and perineum: Normal  Digital rectal exam: Normal sphincter tone without palpated masses or tenderness  Assessment/Plan:  54 y.o. G0 for annual exam.   Well female exam with routine gynecological exam - Education provided on SBEs, importance of preventative screenings, current guidelines, high calcium diet, regular exercise, and multivitamin daily. Labs with primary care.  Perimenopausal - cycles range from 3-6 months. Hot flashes a few years back but does not have menopausal symptoms now.   Uterine leiomyoma, unspecified location - 2000 fibroidectomy, 2013 fibroid embolization.   Follow up in 1 year for annual .     Tamela Gammon [facility], 9:41 AM 09/14/2020

## 2020-09-14 NOTE — Patient Instructions (Signed)

## 2020-10-25 DIAGNOSIS — B009 Herpesviral infection, unspecified: Secondary | ICD-10-CM

## 2020-10-25 MED ORDER — VALACYCLOVIR HCL 500 MG PO TABS: ORAL_TABLET | ORAL | 2 refills | Status: DC

## 2020-11-22 DIAGNOSIS — Z6832 Body mass index (BMI) 32.0-32.9, adult: Secondary | ICD-10-CM | POA: Diagnosis not present

## 2020-11-22 DIAGNOSIS — Z13220 Encounter for screening for lipoid disorders: Secondary | ICD-10-CM | POA: Diagnosis not present

## 2020-11-22 DIAGNOSIS — I10 Essential (primary) hypertension: Secondary | ICD-10-CM | POA: Diagnosis not present

## 2020-11-22 DIAGNOSIS — R7303 Prediabetes: Secondary | ICD-10-CM | POA: Diagnosis not present

## 2021-05-23 DIAGNOSIS — I10 Essential (primary) hypertension: Secondary | ICD-10-CM | POA: Diagnosis not present

## 2021-05-23 DIAGNOSIS — E782 Mixed hyperlipidemia: Secondary | ICD-10-CM | POA: Diagnosis not present

## 2021-05-23 DIAGNOSIS — Z6832 Body mass index (BMI) 32.0-32.9, adult: Secondary | ICD-10-CM | POA: Diagnosis not present

## 2021-05-23 DIAGNOSIS — E1169 Type 2 diabetes mellitus with other specified complication: Secondary | ICD-10-CM | POA: Diagnosis not present

## 2021-06-13 ENCOUNTER — Other Ambulatory Visit: Payer: Self-pay | Admitting: Nurse Practitioner

## 2021-06-13 ENCOUNTER — Other Ambulatory Visit: Payer: Self-pay | Admitting: Obstetrics and Gynecology

## 2021-06-13 DIAGNOSIS — Z1231 Encounter for screening mammogram for malignant neoplasm of breast: Secondary | ICD-10-CM

## 2021-07-06 ENCOUNTER — Other Ambulatory Visit: Payer: Self-pay

## 2021-07-06 ENCOUNTER — Ambulatory Visit
Admission: RE | Admit: 2021-07-06 | Discharge: 2021-07-06 | Disposition: A | Discharge status: Home / Self Care | Payer: Federal, State, Local not specified - PPO | Source: Ambulatory Visit | Attending: Nurse Practitioner | Admitting: Nurse Practitioner

## 2021-07-06 DIAGNOSIS — Z1231 Encounter for screening mammogram for malignant neoplasm of breast: Secondary | ICD-10-CM | POA: Diagnosis not present

## 2021-08-07 ENCOUNTER — Ambulatory Visit: Payer: Federal, State, Local not specified - PPO

## 2021-09-18 ENCOUNTER — Encounter: Payer: Self-pay | Admitting: Nurse Practitioner

## 2021-09-18 ENCOUNTER — Ambulatory Visit (INDEPENDENT_AMBULATORY_CARE_PROVIDER_SITE_OTHER): Payer: Federal, State, Local not specified - PPO | Admitting: Nurse Practitioner

## 2021-09-18 ENCOUNTER — Other Ambulatory Visit: Payer: Self-pay

## 2021-09-18 VITALS — BP 124/82 | Ht 63.0 in | Wt 182.0 lb

## 2021-09-18 DIAGNOSIS — Z780 Asymptomatic menopausal state: Secondary | ICD-10-CM | POA: Diagnosis not present

## 2021-09-18 DIAGNOSIS — Z01419 Encounter for gynecological examination (general) (routine) without abnormal findings: Secondary | ICD-10-CM

## 2021-09-18 NOTE — Progress Notes (Signed)
   Christina Sweeney May 03, 1966 [id number]   History:  55 y.o. G0 presents for annual exam. Postmenopausal - no HRT, denies symptoms. 2004 myomectomy, 2013 fibroid embolization. Normal pap history.  HSV, rare outbreaks.   Gynecologic History Contraception: post menopausal status  Health maintenance Last Pap: 09/09/2017. Results were: normal, 5-year repeat Last mammogram: 07/06/2021. Results were: normal Last colonoscopy: 11/27/2016. Results were: normal, 10-year recall Last Dexa: Not indicated  Past medical history, past surgical history, family history and social history were all reviewed and documented in the EPIC chart. 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 Sister breast cancer at age 47 BRCA negative, mother deceased from ovarian cancer at age 61.   ROS:  A ROS was performed and pertinent positives and negatives are included.  Exam:  Vitals:   09/18/21 0921  BP: 124/82  Weight: 182 lb (82.6 kg)  Height: $Remove'5\' 3"'WwJOIUZ$  (1.6 m)    Body mass index is 32.24 kg/m.  General appearance:  Normal Thyroid:  Symmetrical, normal in size, without palpable masses or nodularity. Respiratory  Auscultation:  Clear without wheezing or rhonchi Cardiovascular  Auscultation:  Regular rate, without rubs, murmurs or gallops  Edema/varicosities:  Not grossly evident Abdominal  Soft,nontender, without masses, guarding or rebound.  Liver/spleen:  No organomegaly noted  Hernia:  None appreciated  Skin  Inspection:  Grossly normal   Breasts: Examined lying and sitting.   Right: Without masses, retractions, discharge or axillary adenopathy.   Left: Without masses, retractions, discharge or axillary adenopathy. Genitourinary   Inguinal/mons:  Normal without inguinal adenopathy  External genitalia:  Normal appearing vulva with no masses, tenderness, or lesions  BUS/Urethra/Skene's glands:  Normal  Vagina:  Normal appearing with normal color and discharge, no  lesions  Cervix:  Normal appearing without discharge or lesions  Uterus:  Difficult to palpate due to body habitus but no gross masses or tenderness  Adnexa/parametria:     Rt: Normal in size, without masses or tenderness.   Lt: Normal in size, without masses or tenderness.  Anus and perineum: Normal  Digital rectal exam: Normal sphincter tone without palpated masses or tenderness  Patient informed chaperone available to be present for breast and pelvic exam. Patient has requested no chaperone to be present. Patient has been advised what will be completed during breast and pelvic exam.   Assessment/Plan:  55 y.o. G0 for annual exam.   Well female exam with routine gynecological exam - Education provided on SBEs, importance of preventative screenings, current guidelines, high calcium diet, regular exercise, and multivitamin daily.  Labs with PCP.   Postmenopausal - no HRT, no bleeding, denies menopausal symptoms.   Screening for cervical cancer - Normal Pap history.  Will repeat at 5-year interval per guidelines.  Screening for breast cancer - Normal mammogram history.  Continue annual screenings.  Normal breast exam today.  Screening for colon cancer - 2017 colonoscopy. Will repeat at GI's recommended interval.   Follow up in 1 year for annual.     Tamela Gammon [facility], 9:43 AM 09/18/2021

## 2021-12-01 DIAGNOSIS — Z1231 Encounter for screening mammogram for malignant neoplasm of breast: Secondary | ICD-10-CM | POA: Diagnosis not present

## 2021-12-01 DIAGNOSIS — Z124 Encounter for screening for malignant neoplasm of cervix: Secondary | ICD-10-CM | POA: Diagnosis not present

## 2021-12-01 DIAGNOSIS — I10 Essential (primary) hypertension: Secondary | ICD-10-CM | POA: Diagnosis not present

## 2021-12-01 DIAGNOSIS — Z0000 Encounter for general adult medical examination without abnormal findings: Secondary | ICD-10-CM | POA: Diagnosis not present

## 2021-12-01 DIAGNOSIS — E1169 Type 2 diabetes mellitus with other specified complication: Secondary | ICD-10-CM | POA: Diagnosis not present

## 2021-12-01 DIAGNOSIS — Z1211 Encounter for screening for malignant neoplasm of colon: Secondary | ICD-10-CM | POA: Diagnosis not present

## 2021-12-01 DIAGNOSIS — E782 Mixed hyperlipidemia: Secondary | ICD-10-CM | POA: Diagnosis not present

## 2022-01-01 ENCOUNTER — Encounter: Payer: Self-pay | Admitting: Nurse Practitioner

## 2022-01-01 DIAGNOSIS — B009 Herpesviral infection, unspecified: Secondary | ICD-10-CM

## 2022-01-01 MED ORDER — VALACYCLOVIR HCL 500 MG PO TABS: ORAL_TABLET | ORAL | 1 refills | Status: DC

## 2022-01-01 NOTE — Telephone Encounter (Signed)
AEX 09/18/21 with Tiffany, NP.

## 2022-01-04 DIAGNOSIS — R809 Proteinuria, unspecified: Secondary | ICD-10-CM | POA: Diagnosis not present

## 2022-03-08 ENCOUNTER — Other Ambulatory Visit: Payer: Self-pay | Admitting: Nurse Practitioner

## 2022-03-08 DIAGNOSIS — B009 Herpesviral infection, unspecified: Secondary | ICD-10-CM

## 2022-03-08 NOTE — Telephone Encounter (Signed)
Medication refill request: Valtrex ?Last AEX:  09-18-21 TW ?Next AEX: 09-19-22 ?Last MMG (if hormonal medication request): n/a ?Refill authorized: Please advise.  ? ?

## 2022-06-01 DIAGNOSIS — I10 Essential (primary) hypertension: Secondary | ICD-10-CM | POA: Diagnosis not present

## 2022-06-01 DIAGNOSIS — E1169 Type 2 diabetes mellitus with other specified complication: Secondary | ICD-10-CM | POA: Diagnosis not present

## 2022-06-01 DIAGNOSIS — E669 Obesity, unspecified: Secondary | ICD-10-CM | POA: Diagnosis not present

## 2022-06-01 DIAGNOSIS — E782 Mixed hyperlipidemia: Secondary | ICD-10-CM | POA: Diagnosis not present

## 2022-07-23 ENCOUNTER — Other Ambulatory Visit: Payer: Self-pay | Admitting: Nurse Practitioner

## 2022-07-23 DIAGNOSIS — Z1231 Encounter for screening mammogram for malignant neoplasm of breast: Secondary | ICD-10-CM

## 2022-08-02 ENCOUNTER — Ambulatory Visit
Admission: RE | Admit: 2022-08-02 | Discharge: 2022-08-02 | Disposition: A | Discharge status: Home / Self Care | Payer: Federal, State, Local not specified - PPO | Source: Ambulatory Visit | Attending: Nurse Practitioner | Admitting: Nurse Practitioner

## 2022-08-02 DIAGNOSIS — Z1231 Encounter for screening mammogram for malignant neoplasm of breast: Secondary | ICD-10-CM

## 2022-09-19 ENCOUNTER — Ambulatory Visit: Payer: Federal, State, Local not specified - PPO | Admitting: Nurse Practitioner

## 2022-09-24 NOTE — Progress Notes (Unsigned)
   Christina Sweeney 12-Mar-1966 [id number]   History:  56 y.o. G0 presents for annual exam. Postmenopausal - no HRT, denies symptoms. 2004 myomectomy, 2013 fibroid embolization. Normal pap history.  HSV, rare outbreaks. HLD, HTN, DM managed by PCP.   Gynecologic History Contraception: post menopausal status Sexually active: No  Health maintenance Last Pap: 09/09/2017. Results were: Normal, 5-year repeat Last mammogram: 08/02/2022. Results were: Normal Last colonoscopy: 11/27/2016. Results were: Normal, 10-year recall Last Dexa: Not indicated  Past medical history, past surgical history, family history and social history were all reviewed and documented in the EPIC chart. 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 Sister breast cancer at age 39 BRCA negative, mother deceased from ovarian cancer at age 71.   ROS:  A ROS was performed and pertinent positives and negatives are included.  Exam:  Vitals:   09/25/22 0926  BP: 126/82  Pulse: 88  SpO2: 99%  Weight: 185 lb (83.9 kg)  Height: $Remove'5\' 3"'tRzVDYN$  (1.6 m)     Body mass index is 32.77 kg/m.  General appearance:  Normal Thyroid:  Symmetrical, normal in size, without palpable masses or nodularity. Respiratory  Auscultation:  Clear without wheezing or rhonchi Cardiovascular  Auscultation:  Regular rate, without rubs, murmurs or gallops  Edema/varicosities:  Not grossly evident Abdominal  Soft,nontender, without masses, guarding or rebound.  Liver/spleen:  No organomegaly noted  Hernia:  None appreciated  Skin  Inspection:  Grossly normal   Breasts: Examined lying and sitting.   Right: Without masses, retractions, discharge or axillary adenopathy.   Left: Without masses, retractions, discharge or axillary adenopathy. Genitourinary   Inguinal/mons:  Normal without inguinal adenopathy  External genitalia:  Normal appearing vulva with no masses, tenderness, or lesions  BUS/Urethra/Skene's glands:  Normal  Vagina:   Normal appearing with normal color and discharge, no lesions  Cervix:  Normal appearing without discharge or lesions  Uterus:  Difficult to palpate due to body habitus but no gross masses or tenderness  Adnexa/parametria:     Rt: Normal in size, without masses or tenderness.   Lt: Normal in size, without masses or tenderness.  Anus and perineum: Normal  Digital rectal exam: Normal sphincter tone without palpated masses or tenderness  Patient informed chaperone available to be present for breast and pelvic exam. Patient has requested no chaperone to be present. Patient has been advised what will be completed during breast and pelvic exam.   Assessment/Plan:  56 y.o. G0 for annual exam.   Well female exam with routine gynecological exam - Plan: Cytology - PAP( [place]). Education provided on SBEs, importance of preventative screenings, current guidelines, high calcium diet, regular exercise, and multivitamin daily.  Labs with PCP.   Postmenopausal - no HRT, no bleeding, denies menopausal symptoms.   HSV infection - Plan: valACYclovir (VALTREX) 500 MG tablet BID x 3-5 days for outbreaks.   Screening for cervical cancer - Normal Pap history. Pap today.   Screening for breast cancer - Normal mammogram history.  Continue annual screenings.  Normal breast exam today.  Screening for colon cancer - 2017 colonoscopy. Will repeat at GI's recommended interval.   Screening for osteoporosis - Average risk. Will plan DXA at age 49.   Follow up in 1 year for annual.     Tamela Gammon [facility], 9:51 AM 09/25/2022

## 2022-09-25 ENCOUNTER — Ambulatory Visit (INDEPENDENT_AMBULATORY_CARE_PROVIDER_SITE_OTHER): Payer: Federal, State, Local not specified - PPO | Admitting: Nurse Practitioner

## 2022-09-25 ENCOUNTER — Other Ambulatory Visit (HOSPITAL_COMMUNITY)
Admission: RE | Admit: 2022-09-25 | Discharge: 2022-09-25 | Disposition: A | Discharge status: Home / Self Care | Payer: Federal, State, Local not specified - PPO | Source: Ambulatory Visit | Attending: Nurse Practitioner | Admitting: Nurse Practitioner

## 2022-09-25 ENCOUNTER — Encounter: Payer: Self-pay | Admitting: Nurse Practitioner

## 2022-09-25 VITALS — BP 126/82 | HR 88 | Ht 63.0 in | Wt 185.0 lb

## 2022-09-25 DIAGNOSIS — Z780 Asymptomatic menopausal state: Secondary | ICD-10-CM | POA: Diagnosis not present

## 2022-09-25 DIAGNOSIS — Z01419 Encounter for gynecological examination (general) (routine) without abnormal findings: Secondary | ICD-10-CM | POA: Insufficient documentation

## 2022-09-25 DIAGNOSIS — B009 Herpesviral infection, unspecified: Secondary | ICD-10-CM

## 2022-09-25 MED ORDER — VALACYCLOVIR HCL 500 MG PO TABS
500.0000 mg | ORAL_TABLET | Freq: Every day | ORAL | 3 refills | Status: DC
Start: 2022-09-25 — End: 2023-10-23

## 2022-09-26 LAB — CYTOLOGY - PAP
Comment: NEGATIVE
Diagnosis: NEGATIVE
High risk HPV: NEGATIVE

## 2022-12-03 DIAGNOSIS — Z1211 Encounter for screening for malignant neoplasm of colon: Secondary | ICD-10-CM | POA: Diagnosis not present

## 2022-12-03 DIAGNOSIS — Z124 Encounter for screening for malignant neoplasm of cervix: Secondary | ICD-10-CM | POA: Diagnosis not present

## 2022-12-03 DIAGNOSIS — E782 Mixed hyperlipidemia: Secondary | ICD-10-CM | POA: Diagnosis not present

## 2022-12-03 DIAGNOSIS — I10 Essential (primary) hypertension: Secondary | ICD-10-CM | POA: Diagnosis not present

## 2022-12-03 DIAGNOSIS — Z0000 Encounter for general adult medical examination without abnormal findings: Secondary | ICD-10-CM | POA: Diagnosis not present

## 2022-12-03 DIAGNOSIS — E669 Obesity, unspecified: Secondary | ICD-10-CM | POA: Diagnosis not present

## 2022-12-03 DIAGNOSIS — E1169 Type 2 diabetes mellitus with other specified complication: Secondary | ICD-10-CM | POA: Diagnosis not present

## 2022-12-03 DIAGNOSIS — Z1231 Encounter for screening mammogram for malignant neoplasm of breast: Secondary | ICD-10-CM | POA: Diagnosis not present

## 2023-06-04 DIAGNOSIS — I10 Essential (primary) hypertension: Secondary | ICD-10-CM | POA: Diagnosis not present

## 2023-06-04 DIAGNOSIS — E782 Mixed hyperlipidemia: Secondary | ICD-10-CM | POA: Diagnosis not present

## 2023-06-04 DIAGNOSIS — E1169 Type 2 diabetes mellitus with other specified complication: Secondary | ICD-10-CM | POA: Diagnosis not present

## 2023-06-04 DIAGNOSIS — E669 Obesity, unspecified: Secondary | ICD-10-CM | POA: Diagnosis not present

## 2023-06-13 DIAGNOSIS — S43402A Unspecified sprain of left shoulder joint, initial encounter: Secondary | ICD-10-CM | POA: Diagnosis not present

## 2023-06-13 DIAGNOSIS — M25512 Pain in left shoulder: Secondary | ICD-10-CM | POA: Diagnosis not present

## 2023-06-17 DIAGNOSIS — M25512 Pain in left shoulder: Secondary | ICD-10-CM | POA: Diagnosis not present

## 2023-06-17 DIAGNOSIS — M79602 Pain in left arm: Secondary | ICD-10-CM | POA: Diagnosis not present

## 2023-06-30 DIAGNOSIS — M25512 Pain in left shoulder: Secondary | ICD-10-CM | POA: Diagnosis not present

## 2023-07-04 DIAGNOSIS — M75102 Unspecified rotator cuff tear or rupture of left shoulder, not specified as traumatic: Secondary | ICD-10-CM | POA: Diagnosis not present

## 2023-08-09 ENCOUNTER — Other Ambulatory Visit: Payer: Self-pay | Admitting: Nurse Practitioner

## 2023-08-09 DIAGNOSIS — Z1231 Encounter for screening mammogram for malignant neoplasm of breast: Secondary | ICD-10-CM

## 2023-08-27 ENCOUNTER — Ambulatory Visit
Admission: RE | Admit: 2023-08-27 | Discharge: 2023-08-27 | Disposition: A | Discharge status: Home / Self Care | Payer: Federal, State, Local not specified - PPO | Source: Ambulatory Visit | Attending: Nurse Practitioner

## 2023-08-27 DIAGNOSIS — Z1231 Encounter for screening mammogram for malignant neoplasm of breast: Secondary | ICD-10-CM | POA: Diagnosis not present

## 2023-09-30 ENCOUNTER — Ambulatory Visit: Payer: Federal, State, Local not specified - PPO | Admitting: Nurse Practitioner

## 2023-10-23 ENCOUNTER — Ambulatory Visit (INDEPENDENT_AMBULATORY_CARE_PROVIDER_SITE_OTHER): Payer: Federal, State, Local not specified - PPO | Admitting: Nurse Practitioner

## 2023-10-23 ENCOUNTER — Encounter: Payer: Self-pay | Admitting: Nurse Practitioner

## 2023-10-23 VITALS — BP 124/72 | HR 68 | Ht 63.0 in | Wt 180.0 lb

## 2023-10-23 DIAGNOSIS — B009 Herpesviral infection, unspecified: Secondary | ICD-10-CM | POA: Diagnosis not present

## 2023-10-23 DIAGNOSIS — Z780 Asymptomatic menopausal state: Secondary | ICD-10-CM | POA: Diagnosis not present

## 2023-10-23 DIAGNOSIS — Z01419 Encounter for gynecological examination (general) (routine) without abnormal findings: Secondary | ICD-10-CM | POA: Diagnosis not present

## 2023-10-23 MED ORDER — VALACYCLOVIR HCL 500 MG PO TABS: 500.0000 mg | ORAL_TABLET | Freq: Every day | ORAL | 3 refills | Status: DC

## 2023-10-23 NOTE — Progress Notes (Signed)
Christina Sweeney 04/12/1966 [id number]   History:  57 y.o. G0 presents for annual exam. Postmenopausal - no HRT, denies symptoms. 2004 myomectomy, 2013 fibroid embolization. Normal pap history.  HSV, rare outbreaks. HLD, HTN, DM managed by PCP.   Gynecologic History Contraception: post menopausal status Sexually active: No  Health maintenance Last Pap: 09/25/2022. Results were: Normal neg HPV, 5-year repeat Last mammogram: 08/27/2023. Results were: Normal Last colonoscopy: 11/27/2016. Results were: Normal, 10-year recall Last Dexa: Not indicated  Past medical history, past surgical history, family history and social history were all reviewed and documented in the EPIC chart. 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 Sister breast cancer at age 34 BRCA negative, mother deceased from ovarian cancer at age 57.   ROS:  A ROS was performed and pertinent positives and negatives are included.  Exam:  Vitals:   10/23/23 1027  BP: 124/72  Pulse: 68  SpO2: 100%  Weight: 180 lb (81.6 kg)  Height: 5\' 3"  (1.6 m)      Body mass index is 31.89 kg/m.  General appearance:  Normal Thyroid:  Symmetrical, normal in size, without palpable masses or nodularity. Respiratory  Auscultation:  Clear without wheezing or rhonchi Cardiovascular  Auscultation:  Regular rate, without rubs, murmurs or gallops  Edema/varicosities:  Not grossly evident Abdominal  Soft,nontender, without masses, guarding or rebound.  Liver/spleen:  No organomegaly noted  Hernia:  None appreciated  Skin  Inspection:  Grossly normal   Breasts: Examined lying and sitting.   Right: Without masses, retractions, discharge or axillary adenopathy.   Left: Without masses, retractions, discharge or axillary adenopathy. Pelvic: External genitalia:  no lesions              Urethra:  normal appearing urethra with no masses, tenderness or lesions               Bartholins and Skenes: normal                 Vagina: normal appearing vagina with normal color and discharge, no lesions              Cervix: no lesions Bimanual Exam:  Uterus:  no masses or tenderness              Adnexa: no mass, fullness, tenderness              Rectovaginal: Deferred              Anus:  normal, no lesions  Patient informed chaperone available to be present for breast and pelvic exam. Patient has requested no chaperone to be present. Patient has been advised what will be completed during breast and pelvic exam.   Assessment/Plan:  57 y.o. G0 for annual exam.   Well female exam with routine gynecological exam - Education provided on SBEs, importance of preventative screenings, current guidelines, high calcium diet, regular exercise, and multivitamin daily.  Labs with PCP.   Postmenopausal - no HRT, no bleeding.   HSV infection - Plan: valACYclovir (VALTREX) 500 MG tablet BID x 3-5 days for outbreaks.   Screening for cervical cancer - Normal Pap history. Will repeat at 5-year interval per guidelines.   Screening for breast cancer - Normal mammogram history.  Continue annual screenings.  Normal breast exam today.  Screening for colon cancer - 2017 colonoscopy. Will repeat at GI's recommended interval.   Screening for osteoporosis - Average risk. Will  plan DXA at age 30.   Return in about 1 year (around 10/22/2024) for Annual.    Olivia Mackie [facility], 10:53 AM 10/23/2023

## 2023-11-27 DIAGNOSIS — F439 Reaction to severe stress, unspecified: Secondary | ICD-10-CM | POA: Diagnosis not present

## 2023-11-27 DIAGNOSIS — E782 Mixed hyperlipidemia: Secondary | ICD-10-CM | POA: Diagnosis not present

## 2023-11-27 DIAGNOSIS — E1169 Type 2 diabetes mellitus with other specified complication: Secondary | ICD-10-CM | POA: Diagnosis not present

## 2023-11-27 DIAGNOSIS — I10 Essential (primary) hypertension: Secondary | ICD-10-CM | POA: Diagnosis not present

## 2024-05-19 DIAGNOSIS — Z0000 Encounter for general adult medical examination without abnormal findings: Secondary | ICD-10-CM | POA: Diagnosis not present

## 2024-05-19 DIAGNOSIS — Z23 Encounter for immunization: Secondary | ICD-10-CM | POA: Diagnosis not present

## 2024-05-19 DIAGNOSIS — E782 Mixed hyperlipidemia: Secondary | ICD-10-CM | POA: Diagnosis not present

## 2024-05-19 DIAGNOSIS — I10 Essential (primary) hypertension: Secondary | ICD-10-CM | POA: Diagnosis not present

## 2024-05-19 DIAGNOSIS — E1169 Type 2 diabetes mellitus with other specified complication: Secondary | ICD-10-CM | POA: Diagnosis not present

## 2024-05-19 DIAGNOSIS — Z6833 Body mass index (BMI) 33.0-33.9, adult: Secondary | ICD-10-CM | POA: Diagnosis not present

## 2024-06-18 ENCOUNTER — Other Ambulatory Visit: Payer: Self-pay | Admitting: Nurse Practitioner

## 2024-06-18 DIAGNOSIS — B009 Herpesviral infection, unspecified: Secondary | ICD-10-CM

## 2024-06-18 NOTE — Telephone Encounter (Signed)
 Med refill request: valacyclovir  500 mg Last AEX: 10/23/23 Next AEX: 10/26/24 Last MMG (if hormonal med) n/a Refill authorized: Please Advise?

## 2024-08-31 ENCOUNTER — Other Ambulatory Visit: Payer: Self-pay | Admitting: Nurse Practitioner

## 2024-08-31 DIAGNOSIS — Z1231 Encounter for screening mammogram for malignant neoplasm of breast: Secondary | ICD-10-CM

## 2024-09-10 ENCOUNTER — Ambulatory Visit
Admission: RE | Admit: 2024-09-10 | Discharge: 2024-09-10 | Disposition: A | Discharge status: Home / Self Care | Source: Ambulatory Visit | Attending: Nurse Practitioner | Admitting: Nurse Practitioner

## 2024-09-10 DIAGNOSIS — Z1231 Encounter for screening mammogram for malignant neoplasm of breast: Secondary | ICD-10-CM

## 2024-10-26 ENCOUNTER — Encounter: Payer: Self-pay | Admitting: Nurse Practitioner

## 2024-10-26 ENCOUNTER — Ambulatory Visit: Payer: Federal, State, Local not specified - PPO | Admitting: Nurse Practitioner

## 2024-10-26 VITALS — BP 118/78 | HR 78 | Ht 63.0 in | Wt 183.0 lb

## 2024-10-26 DIAGNOSIS — Z1331 Encounter for screening for depression: Secondary | ICD-10-CM | POA: Diagnosis not present

## 2024-10-26 DIAGNOSIS — B009 Herpesviral infection, unspecified: Secondary | ICD-10-CM | POA: Diagnosis not present

## 2024-10-26 DIAGNOSIS — Z01419 Encounter for gynecological examination (general) (routine) without abnormal findings: Secondary | ICD-10-CM | POA: Diagnosis not present

## 2024-10-26 DIAGNOSIS — Z780 Asymptomatic menopausal state: Secondary | ICD-10-CM | POA: Diagnosis not present

## 2024-10-26 NOTE — Progress Notes (Signed)
 Christina Sweeney [birth year] [id number]   History:  58 y.o. G0 presents for annual exam. Postmenopausal - no HRT, no bleeding. 2004 myomectomy, 2013 fibroid embolization. Normal pap history.  HSV, rare outbreaks. HLD, HTN, DM managed by PCP.   Gynecologic History Contraception: post menopausal status Sexually active: No  Health maintenance Last Pap: 09/25/2022. Results were: Normal neg HPV Last mammogram: 09/10/2024. Results were: Normal Last colonoscopy: 11/27/2016. Results were: Normal, 10-year recall Last Dexa: Not indicated     10/26/2024   10:37 AM  Depression screen PHQ 2/9  Decreased Interest 0  Down, Depressed, Hopeless 0  PHQ - 2 Score 0     Past medical history, past surgical history, family history and social history were all reviewed and documented in the EPIC chart. 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 Sister breast cancer at age 59 BRCA negative, mother deceased from ovarian cancer at age 31.   ROS:  A ROS was performed and pertinent positives and negatives are included.  Exam:  Vitals:   10/26/24 1009  BP: 118/78  Pulse: 78  SpO2: 98%  Weight: 183 lb (83 kg)  Height: 5' 3 (1.6 m)       Body mass index is 32.42 kg/m.  General appearance:  Normal Thyroid :  Symmetrical, normal in size, without palpable masses or nodularity. Respiratory  Auscultation:  Clear without wheezing or rhonchi Cardiovascular  Auscultation:  Regular rate, without rubs, murmurs or gallops  Edema/varicosities:  Not grossly evident Abdominal  Soft,nontender, without masses, guarding or rebound.  Liver/spleen:  No organomegaly noted  Hernia:  None appreciated  Skin  Inspection:  Grossly normal   Breasts: Examined lying and sitting.   Right: Without masses, retractions, discharge or axillary adenopathy.   Left: Without masses, retractions, discharge or axillary adenopathy. Pelvic: External genitalia:  no lesions               Urethra:  normal appearing urethra with no masses, tenderness or lesions              Bartholins and Skenes: normal                 Vagina: normal appearing vagina with normal color and discharge, no lesions              Cervix: no lesions Bimanual Exam:  Uterus:  no masses or tenderness              Adnexa: no mass, fullness, tenderness              Rectovaginal: Deferred              Anus:  normal, no lesions  Christina Sweeney, CMA present as chaperone.   Assessment/Plan:  58 y.o. G0 for annual exam.   Well female exam with routine gynecological exam - Education provided on SBEs, importance of preventative screenings, current guidelines, high calcium diet, regular exercise, and multivitamin daily.  Labs with PCP.   Postmenopausal - no HRT, no bleeding.   HSV infection - Valtrex  as needed. Does not need refills at this time.   Screening for cervical cancer - Normal Pap history. Will repeat at 5-year interval per guidelines.   Screening for breast cancer - Normal mammogram history.  Continue annual screenings.  Normal breast exam today.  Screening for colon cancer - 2017 colonoscopy. Will repeat at GI's recommended interval.   Screening for osteoporosis - Average risk. Will plan DXA at age 64.  Return in about 1 year (around 10/26/2025) for Annual.    Christina Sweeney [facility], 10:39 AM 10/26/2024

## 2024-11-19 DIAGNOSIS — E782 Mixed hyperlipidemia: Secondary | ICD-10-CM | POA: Diagnosis not present

## 2024-11-19 DIAGNOSIS — I10 Essential (primary) hypertension: Secondary | ICD-10-CM | POA: Diagnosis not present

## 2024-11-19 DIAGNOSIS — E1169 Type 2 diabetes mellitus with other specified complication: Secondary | ICD-10-CM | POA: Diagnosis not present

## 2025-10-28 ENCOUNTER — Ambulatory Visit: Admitting: Nurse Practitioner
